# Patient Record
Sex: Male | Born: 1960
Health system: Southern US, Community
[De-identification: ages and names within clinical notes are randomized; demographics above are authoritative.]

## PROBLEM LIST (undated history)

## (undated) DIAGNOSIS — M1990 Unspecified osteoarthritis, unspecified site: Secondary | ICD-10-CM

## (undated) HISTORY — DX: Unspecified osteoarthritis, unspecified site: M19.90

---

## 1991-07-15 HISTORY — PX: CYST EXCISION: SHX5701

## 2013-10-12 ENCOUNTER — Ambulatory Visit: Payer: Commercial Managed Care - PPO

## 2013-10-20 ENCOUNTER — Ambulatory Visit (INDEPENDENT_AMBULATORY_CARE_PROVIDER_SITE_OTHER): Payer: Commercial Managed Care - PPO | Admitting: Family Medicine

## 2013-10-20 VITALS — BP 102/64 | HR 61 | Temp 97.8°F | Resp 16 | Ht 65.0 in | Wt 169.0 lb

## 2013-10-20 DIAGNOSIS — R0989 Other specified symptoms and signs involving the circulatory and respiratory systems: Secondary | ICD-10-CM

## 2013-10-20 DIAGNOSIS — R0609 Other forms of dyspnea: Secondary | ICD-10-CM

## 2013-10-20 DIAGNOSIS — R0683 Snoring: Secondary | ICD-10-CM

## 2013-10-20 NOTE — Progress Notes (Signed)
[facility] [address] [place] [address] [phone]  Date:  10/20/2013   Name:  Billy Rocha   DOB:  Mar 17, 1961   MRN:  [id number]  PCP:  No primary provider on file.    Chief Complaint: Snoring   History of Present Illness:  Billy Rocha is a 53 y.o. very pleasant male patient who presents with the following: snoring that has been present for several years.  Wife has told him that it sounds like he stops breathing at night sometimes.  Pt states that on Monday night he woke from sleep gasping for breath.  Denies any associated chest pain or diaphoresis during this incident.  Admits daytime sleepiness.  Is not falling asleep while driving or requiring daytime naps.  Also complaining of constant throat clearing with no associated dyspepsia.    He is very active and runs about 3 miles several times a week.  No CP with exercise.   There are no active problems to display for this patient.   History reviewed. No pertinent past medical history.  History reviewed. No pertinent past surgical history.  History  Substance Use Topics  . Smoking status: Never Smoker   . Smokeless tobacco: Not on file  . Alcohol Use: No    Family History  Problem Relation Age of Onset  . Cancer Mother   . Diabetes Mother   . Heart disease Mother     Not on File  Medication list has been reviewed and updated.  No current outpatient prescriptions on file prior to visit.   No current facility-administered medications on file prior to visit.    Review of Systems:  Gen: denies fevers, night sweats, wt changes Cardio- denies chest pain, palpitation, and sob Lungs- denies wheeze, cough GI- denies abdominal pain, diarrhea , constipation GU- denies painful urination, admits difficulty with maintaining erection but able to achieve Vascular- denies claudication and exercise intolerance  Physical Examination: Filed Vitals:   10/20/13 0913  BP: 102/64   Pulse: 61  Temp: 97.8 F (36.6 C)  Resp: 16   Filed Vitals:   10/20/13 0913  Height: 5\' 5"  (1.651 m)  Weight: 169 lb (76.658 kg)   Body mass index is 28.12 kg/(m^2). Ideal Body Weight:   GEN: WDWN, NAD, Non-toxic, A & O x 3, looks well, muscular build HEENT: Atraumatic, Normocephalic. Neck supple. No masses, No LAD.   He does have somewhat large tonsils and small oral cavity, prominent nasal turbinates.   Ears and Nose: No external deformity. No erythema.  Malintini 4.  CV: RRR, No M/G/R. No JVD. No thrill. No extra heart sounds. PULM: CTA B, no wheezes, crackles, rhonchi. No retractions. No resp. distress. No accessory muscle use. ABD: S, NT, ND, +BS. No rebound. No HSM.  EXTR: No c/c/e NEURO Normal gait.  PSYCH: Normally interactive. Conversant. Not depressed or anxious appearing.  Calm demeanor.     Assessment and Plan: 1. Snoring - schedule ENT visit 2. GERD - given instructions for diet control.  Given coupon for Prilosec.   Discussed in detail with pt.  He may need a sleep study to formally diagnose OSA.  However he is not overweight and does not have a large neck; suspect the issue may lie in his oral anatomy.  He would like to see ENT to find out is a tonsillectomy might be helpful for him first.    He is drinking peppermint tea before bed- stop this as it may contribute to  his GERD.   Signed Lamar Blinks, MD

## 2013-10-20 NOTE — Patient Instructions (Addendum)
You have been diagnosed with possible obstructive sleep apnea.  You have been scheduled for an appointment with an [place] and Throat doctor.  Please purchase Breath-right strips at the pharmacy to wear while you sleep.  These strips should help open your airway while you sleep.  You have also been given coupons for acid reflux. Please take 1 pill in the morning prior to eating.  Please avoid peppermint tea prior to bed time.  This should help with reflux.

## 2014-02-03 ENCOUNTER — Ambulatory Visit (INDEPENDENT_AMBULATORY_CARE_PROVIDER_SITE_OTHER): Payer: Commercial Managed Care - PPO | Admitting: Family Medicine

## 2014-02-03 VITALS — BP 106/72 | HR 56 | Temp 97.7°F | Resp 18 | Ht 64.75 in | Wt 169.6 lb

## 2014-02-03 DIAGNOSIS — R05 Cough: Secondary | ICD-10-CM

## 2014-02-03 DIAGNOSIS — J018 Other acute sinusitis: Secondary | ICD-10-CM

## 2014-02-03 DIAGNOSIS — R059 Cough, unspecified: Secondary | ICD-10-CM

## 2014-02-03 MED ORDER — BENZONATATE 100 MG PO CAPS: 100.0000 mg | ORAL_CAPSULE | Freq: Three times a day (TID) | ORAL | Status: DC | PRN

## 2014-02-03 MED ORDER — AMOXICILLIN 875 MG PO TABS: 875.0000 mg | ORAL_TABLET | Freq: Two times a day (BID) | ORAL | Status: DC

## 2014-02-03 MED ORDER — FLUTICASONE PROPIONATE 50 MCG/ACT NA SUSP: NASAL | Status: DC

## 2014-02-03 NOTE — Progress Notes (Signed)
Subjective: Last week patient has been having congestion in his head and bring a lot of purulent mucus out of his nose. He has postnasal drainage and cough. He does not smoke. He works in [employer]. He has not been feverish. Actually this been going on 9 days, and he tried to right it through but it continues to persist.  Objective: Healthy-appearing man but he has a cough and congestion. His TMs are normal. Nose congested. Throat clear. Neck supple without significant nodes. Chest clear. Heart regular without murmurs.  Assessment: Sinusitis/cough  Plan: Amoxicillin Fluticasone Tessalon He did go see an ENT doctor after his last visit. It sounds like they placed him on some medication for reflux, do not know the name. He feels like that brought on the reflux, that he was simply having throat symptoms at that time but then he started getting heartburn thereafter. He was frustrated with that. I still feel he probably might have reflux based on that history, and would suggest he see the ENT doctor back again if symptoms there continues to persist.

## 2014-02-03 NOTE — Patient Instructions (Signed)
Drink plenty of fluids to stay well hydrated  Take the antibiotic, amoxicillin, one twice daily for infection  Use the fluticasone nose spray 2 sprays each nostril twice daily for 4 days, then decrease to once daily  Take the cough pills one or 2 pills 3 times daily as needed for cough  If you continue having the reflux problems down in your chest that you were treated for at the medicine per Dr., I would recommend you return to them. It sounds like a different medication may need to be givien you.

## 2014-03-31 ENCOUNTER — Ambulatory Visit (INDEPENDENT_AMBULATORY_CARE_PROVIDER_SITE_OTHER): Payer: Commercial Managed Care - PPO | Admitting: Internal Medicine

## 2014-03-31 VITALS — BP 120/72 | HR 59 | Temp 97.9°F | Resp 18 | Ht 65.0 in | Wt 171.0 lb

## 2014-03-31 DIAGNOSIS — M542 Cervicalgia: Secondary | ICD-10-CM

## 2014-03-31 DIAGNOSIS — Z13220 Encounter for screening for lipoid disorders: Secondary | ICD-10-CM

## 2014-03-31 DIAGNOSIS — R350 Frequency of micturition: Secondary | ICD-10-CM

## 2014-03-31 DIAGNOSIS — G479 Sleep disorder, unspecified: Secondary | ICD-10-CM

## 2014-03-31 DIAGNOSIS — Z1211 Encounter for screening for malignant neoplasm of colon: Secondary | ICD-10-CM

## 2014-03-31 DIAGNOSIS — M545 Low back pain, unspecified: Secondary | ICD-10-CM

## 2014-03-31 DIAGNOSIS — R202 Paresthesia of skin: Secondary | ICD-10-CM

## 2014-03-31 DIAGNOSIS — IMO0002 Reserved for concepts with insufficient information to code with codable children: Secondary | ICD-10-CM

## 2014-03-31 DIAGNOSIS — R5383 Other fatigue: Secondary | ICD-10-CM

## 2014-03-31 DIAGNOSIS — R5381 Other malaise: Secondary | ICD-10-CM

## 2014-03-31 DIAGNOSIS — Z125 Encounter for screening for malignant neoplasm of prostate: Secondary | ICD-10-CM

## 2014-03-31 DIAGNOSIS — M1710 Unilateral primary osteoarthritis, unspecified knee: Secondary | ICD-10-CM

## 2014-03-31 DIAGNOSIS — R209 Unspecified disturbances of skin sensation: Secondary | ICD-10-CM

## 2014-03-31 DIAGNOSIS — Z0000 Encounter for general adult medical examination without abnormal findings: Secondary | ICD-10-CM

## 2014-03-31 DIAGNOSIS — Z23 Encounter for immunization: Secondary | ICD-10-CM

## 2014-03-31 DIAGNOSIS — M170 Bilateral primary osteoarthritis of knee: Secondary | ICD-10-CM | POA: Insufficient documentation

## 2014-03-31 LAB — CBC
HEMATOCRIT: 41.9 % (ref 39.0–52.0)
Hemoglobin: 14.4 g/dL (ref 13.0–17.0)
MCH: 28.6 pg (ref 26.0–34.0)
MCHC: 34.4 g/dL (ref 30.0–36.0)
MCV: 83.1 fL (ref 78.0–100.0)
PLATELETS: 184 10*3/uL (ref 150–400)
RBC: 5.04 MIL/uL (ref 4.22–5.81)
RDW: 14.2 % (ref 11.5–15.5)
WBC: 4.4 10*3/uL (ref 4.0–10.5)

## 2014-03-31 LAB — IFOBT (OCCULT BLOOD): IFOBT: NEGATIVE

## 2014-03-31 LAB — COMPREHENSIVE METABOLIC PANEL
ALBUMIN: 4.3 g/dL (ref 3.5–5.2)
ALT: 23 U/L (ref 0–53)
AST: 20 U/L (ref 0–37)
Alkaline Phosphatase: 52 U/L (ref 39–117)
BUN: 12 mg/dL (ref 6–23)
CALCIUM: 8.9 mg/dL (ref 8.4–10.5)
CHLORIDE: 104 meq/L (ref 96–112)
CO2: 26 mEq/L (ref 19–32)
Creat: 0.93 mg/dL (ref 0.50–1.35)
Glucose, Bld: 92 mg/dL (ref 70–99)
POTASSIUM: 4.5 meq/L (ref 3.5–5.3)
Sodium: 138 mEq/L (ref 135–145)
Total Bilirubin: 0.7 mg/dL (ref 0.2–1.2)
Total Protein: 7 g/dL (ref 6.0–8.3)

## 2014-03-31 LAB — LIPID PANEL
Cholesterol: 168 mg/dL (ref 0–200)
HDL: 67 mg/dL (ref >39)
LDL Cholesterol: 88 mg/dL (ref 0–99)
Total CHOL/HDL Ratio: 2.5 Ratio
Triglycerides: 67 mg/dL (ref <150)
VLDL: 13 mg/dL (ref 0–40)

## 2014-03-31 LAB — POCT URINALYSIS DIPSTICK
Bilirubin, UA: NEGATIVE
Blood, UA: NEGATIVE
GLUCOSE UA: NEGATIVE
KETONES UA: NEGATIVE
LEUKOCYTES UA: NEGATIVE
Nitrite, UA: NEGATIVE
Protein, UA: NEGATIVE
Spec Grav, UA: 1.015
UROBILINOGEN UA: 0.2
pH, UA: 8.5

## 2014-03-31 NOTE — Progress Notes (Signed)
Subjective:    Patient ID: Billy Rocha, male    DOB: 1961/02/02, 53 y.o.   MRN: [id number] This chart was scribed for [place]. Laney Pastor, MD by Steva Colder, ED Scribe. The patient was seen in room 4 at 9:32 AM.   Chief Complaint  Patient presents with  . Annual Exam    HPI Billy Rocha is a 53 y.o. male who presents today complaining of an annual exam. He states that he he came and visited Dr. Edilia Bo for his sleeping issues and she sent him to a ENT specialist who states that he was dx with heartburn. He states that the issues have gotten worse. He states that he will not go back to the specialist. He states that he was 53 years old when it first happened. He states that the issues are intermittent with the last one being 1 year ago. He states that he has to clear his throat every night before he goes to bed. He states that his wife states that he will stop snoring and breathing at night time. He states that he has not had a sleep study conducted before. He states that he is having associated symptoms of neck pain, back pain, and intermittment numbness in both legs after sitting for awhile. He denies tingling, and any other associated symptoms. He states that he does not like to go to doctors because they don't always help. He states that he runs 3 days a week for his exercise, and he does not stretch as he should. He denies having a colonoscopy. He states that no one in his family has had colon CA or prostate CA. He states that he does not remember when his last tetanus shot was. He states that he had his cholesterol checked 1 year ago.    There are no active problems to display for this patient.  History reviewed. No pertinent past medical history. History reviewed. No pertinent past surgical history. No Known Allergies Prior to Admission medications   Medication Sig Start Date End Date Taking? Authorizing Provider  amoxicillin (AMOXIL) 875 MG tablet Take 1 tablet  (875 mg total) by mouth 2 (two) times daily. 02/03/14   Posey Boyer, MD  benzonatate (TESSALON) 100 MG capsule Take 1-2 capsules (100-200 mg total) by mouth 3 (three) times daily as needed. 02/03/14   Posey Boyer, MD  fluticasone Asencion Islam) 50 MCG/ACT nasal spray Use 2 sprays each nostril twice daily for 4 days, then decrease to once daily 02/03/14   Posey Boyer, MD      Review of Systems  14 points reviewed by intake form Positives covered in present illness Remainder systems negative  Objective:  Physical Exam  Constitutional: He is oriented to person, place, and time. He appears well-developed and well-nourished.  HENT:  Head: Normocephalic.  Right Ear: External ear normal.  Left Ear: External ear normal.  Nose: Nose normal.  Mouth/Throat: Oropharynx is clear and moist.  Tms and canals clear Shallow posterior pharynx  Eyes: Conjunctivae and EOM are normal. Pupils are equal, round, and reactive to light.  Neck: Normal range of motion. Neck supple. No thyromegaly present.  Cardiovascular: Normal rate, regular rhythm, normal heart sounds and intact distal pulses.   No murmur heard. Pulmonary/Chest: Effort normal and breath sounds normal. No respiratory distress. He has no wheezes. He has no rales.  Abdominal: Soft. Bowel sounds are normal. He exhibits no distension and no mass. There is no tenderness. There is no rebound and no  guarding.  No hepatosplenomegaly  Genitourinary: Guaiac negative stool.  Prostate is symmetrical and enlarged but soft nontender without nodules  Musculoskeletal: Normal range of motion. He exhibits no edema and no tenderness.  He has very little leg extension due to muscle tightness about both hips and both posterior thighs Straight leg raise does not create radicular symptoms from the lumbar area He has no peripheral motor losses Deep tendon reflexes are muted but symmetrical  there is some tenderness with extension of the knees but no effusion or  ligamentous laxity There is some tenderness in the patellar tendon mechanism bilaterally   Lymphadenopathy:    He has no cervical adenopathy.  Neurological: He is alert and oriented to person, place, and time. He has normal reflexes. No cranial nerve deficit. He exhibits normal muscle tone. Coordination normal.  Skin: Skin is warm and dry. No rash noted.  Psychiatric: He has a normal mood and affect. His behavior is normal. Judgment and thought content normal.    BP 120/72  Pulse 59  Temp(Src) 97.9 F (36.6 C) (Oral)  Resp 18  Ht 5\' 5"  (1.651 m)  Wt 171 lb (77.565 kg)  BMI 28.46 kg/m2  SpO2 97%  Assessment & Plan:  I personally performed the services described in this documentation, which was scribed in my presence. The recorded information has been reviewed and is accurate.   Routine general medical examination at a health care facility - Plan: POCT urinalysis dipstick, CBC, Comprehensive metabolic panel, Lipid panel, PSA, IFOBT POC (occult bld, rslt in office)  Screening cholesterol level - Plan: Lipid panel  Screening for prostate cancer - Plan: PSA  Colon cancer screening - Plan: Comprehensive metabolic panel  Other fatigue - with observed snoring and  Apnea, and daytime hypersomnolence  Paresthesia--in both both legs exacerbated by sitting position  Neck pain  Low back pain without sciatica, unspecified back pain laterality  Frequent urination  Need for prophylactic vaccination with combined diphtheria-tetanus-pertussis (DTP) vaccine - Plan: Tdap vaccine greater than or equal to 7yo IM  Arthritis of both knees  He needs significant work on flexibility about all his joints with an intention to repair his chronic neck and low back pain and symptoms about his knees it may be secondary to running without an adequate leg lift due to hip tightness and quads tightness--- referred to sports medicine clinic and to [facility]  He needs a sleep study as he has  hypersomnolence with observed apnea and significant snoring  He is referred for colonoscopy-retaining  He may be a candidate for Flomax to prevent nocturia if this becomes a problem again  Patient Instructions  Physical therapy with Barbaraann Barthel at downtown [facility] Screening colonoscopy needed to check for colon cancer Overnight sleep assessment needed to be sure there is no sleep apnea Labs will be mailed to you

## 2014-03-31 NOTE — Patient Instructions (Addendum)
Physical therapy with Barbaraann Barthel at downtown [facility] Screening colonoscopy needed to check for colon cancer Overnight sleep assessment needed to be sure there is no sleep apnea Labs will be mailed to you  Tdap Vaccine (Tetanus, Diphtheria, Pertussis): What You Need to Know 1. Why get vaccinated? Tetanus, diphtheria and pertussis can be very serious diseases, even for adolescents and adults. Tdap vaccine can protect [place] from these diseases. TETANUS (Lockjaw) causes painful muscle tightening and stiffness, usually all over the body.  It can lead to tightening of muscles in the head and neck so you can't open your mouth, swallow, or sometimes even breathe. Tetanus kills about 1 out of 5 people who are infected. DIPHTHERIA can cause a thick coating to form in the back of the throat.  It can lead to breathing problems, paralysis, heart failure, and death. PERTUSSIS (Whooping Cough) causes severe coughing spells, which can cause difficulty breathing, vomiting and disturbed sleep.  It can also lead to weight loss, incontinence, and rib fractures. Up to 2 in 100 adolescents and 5 in 100 adults with pertussis are hospitalized or have complications, which could include pneumonia or death. These diseases are caused by bacteria. Diphtheria and pertussis are spread from person to person through coughing or sneezing. Tetanus enters the body through cuts, scratches, or wounds. Before vaccines, the [place] saw as many as 200,000 cases a year of diphtheria and pertussis, and hundreds of cases of tetanus. Since vaccination began, tetanus and diphtheria have dropped by about 99% and pertussis by about 80%. 2. Tdap vaccine Tdap vaccine can protect adolescents and adults from tetanus, diphtheria, and pertussis. One dose of Tdap is routinely given at age 15 or 81. People who did not get Tdap at that age should get it as soon as possible. Tdap is especially important for health care professionals and anyone  having close contact with a baby younger than 12 months. Pregnant women should get a dose of Tdap during every pregnancy, to protect the newborn from pertussis. Infants are most at risk for severe, life-threatening complications from pertussis. A similar vaccine, called Td, protects from tetanus and diphtheria, but not pertussis. A Td booster should be given every 10 years. Tdap may be given as one of these boosters if you have not already gotten a dose. Tdap may also be given after a severe cut or burn to prevent tetanus infection. Your doctor can give you more information. Tdap may safely be given at the same time as other vaccines. 3. Some people should not get this vaccine  If you ever had a life-threatening allergic reaction after a dose of any tetanus, diphtheria, or pertussis containing vaccine, OR if you have a severe allergy to any part of this vaccine, you should not get Tdap. Tell your doctor if you have any severe allergies.  If you had a coma, or long or multiple seizures within 7 days after a childhood dose of DTP or DTaP, you should not get Tdap, unless a cause other than the vaccine was found. You can still get Td.  Talk to your doctor if you:  have epilepsy or another nervous system problem,  had severe pain or swelling after any vaccine containing diphtheria, tetanus or pertussis,  ever had Guillain-Barr Syndrome (GBS),  aren't feeling well on the day the shot is scheduled. 4. Risks of a vaccine reaction With any medicine, including vaccines, there is a chance of side effects. These are usually mild and go away on their own, but  serious reactions are also possible. Brief fainting spells can follow a vaccination, leading to injuries from falling. Sitting or lying down for about 15 minutes can help prevent these. Tell your doctor if you feel dizzy or light-headed, or have vision changes or ringing in the ears. Mild problems following Tdap (Did not interfere with  activities)  Pain where the shot was given (about 3 in 4 adolescents or 2 in 3 adults)  Redness or swelling where the shot was given (about 1 person in 5)  Mild fever of at least 100.27F (up to about 1 in 25 adolescents or 1 in 100 adults)  Headache (about 3 or 4 people in 10)  Tiredness (about 1 person in 3 or 4)  Nausea, vomiting, diarrhea, stomach ache (up to 1 in 4 adolescents or 1 in 10 adults)  Chills, body aches, sore joints, rash, swollen glands (uncommon) Moderate problems following Tdap (Interfered with activities, but did not require medical attention)  Pain where the shot was given (about 1 in 5 adolescents or 1 in 100 adults)  Redness or swelling where the shot was given (up to about 1 in 16 adolescents or 1 in 25 adults)  Fever over 102F (about 1 in 100 adolescents or 1 in 250 adults)  Headache (about 3 in 20 adolescents or 1 in 10 adults)  Nausea, vomiting, diarrhea, stomach ache (up to 1 or 3 people in 100)  Swelling of the entire arm where the shot was given (up to about 3 in 100). Severe problems following Tdap (Unable to perform usual activities; required medical attention)  Swelling, severe pain, bleeding and redness in the arm where the shot was given (rare). A severe allergic reaction could occur after any vaccine (estimated less than 1 in a million doses). 5. What if there is a serious reaction? What should I look for?  Look for anything that concerns you, such as signs of a severe allergic reaction, very high fever, or behavior changes. Signs of a severe allergic reaction can include hives, swelling of the face and throat, difficulty breathing, a fast heartbeat, dizziness, and weakness. These would start a few minutes to a few hours after the vaccination. What should I do?  If you think it is a severe allergic reaction or other emergency that can't wait, call 9-1-1 or get the person to the nearest hospital. Otherwise, call your doctor.  Afterward,  the reaction should be reported to the "Vaccine Adverse Event Reporting System" (VAERS). Your doctor might file this report, or you can do it yourself through the VAERS web site at www.vaers.SamedayNews.es, or by calling (814)318-8945. VAERS is only for reporting reactions. They do not give medical advice.  6. The National Vaccine Injury Compensation Program The [employer] Vaccine Injury Compensation Program (VICP) is a federal program that was created to compensate people who may have been injured by certain vaccines. Persons who believe they may have been injured by a vaccine can learn about the program and about filing a claim by calling 819-013-8473 or visiting the [place] website at GoldCloset.com.ee. 7. How can I learn more?  Ask your doctor.  Call your local or state health department.  Contact the Centers for Disease Control and Prevention (CDC):  Call 986-063-7348 or visit CDC's website at http://hunter.com/. CDC Tdap Vaccine VIS (11/20/11) Document Released: 12/30/2011 Document Revised: 11/14/2013 Document Reviewed: 10/12/2013 ExitCare Patient Information 2015 [place], [place]. This information is not intended to replace advice given to you by your health care provider. Make sure you  discuss any questions you have with your health care provider.  

## 2014-04-01 LAB — PSA: PSA: 0.81 ng/mL (ref <=4.00)

## 2014-04-03 ENCOUNTER — Encounter: Payer: Self-pay | Admitting: Internal Medicine

## 2014-04-10 ENCOUNTER — Institutional Professional Consult (permissible substitution): Payer: Commercial Managed Care - PPO | Admitting: Neurology

## 2014-09-14 ENCOUNTER — Ambulatory Visit (INDEPENDENT_AMBULATORY_CARE_PROVIDER_SITE_OTHER): Payer: Commercial Managed Care - PPO | Admitting: Family Medicine

## 2014-09-14 VITALS — BP 108/64 | HR 51 | Temp 97.8°F | Resp 16 | Ht 65.5 in | Wt 175.8 lb

## 2014-09-14 DIAGNOSIS — M545 Low back pain, unspecified: Secondary | ICD-10-CM

## 2014-09-14 LAB — POCT UA - MICROSCOPIC ONLY
Bacteria, U Microscopic: NEGATIVE
Casts, Ur, LPF, POC: NEGATIVE
Crystals, Ur, HPF, POC: NEGATIVE
Epithelial cells, urine per micros: NEGATIVE
MUCUS UA: NEGATIVE
RBC, urine, microscopic: NEGATIVE
WBC, UR, HPF, POC: NEGATIVE
Yeast, UA: NEGATIVE

## 2014-09-14 LAB — POCT URINALYSIS DIPSTICK
Bilirubin, UA: NEGATIVE
Glucose, UA: NEGATIVE
Ketones, UA: NEGATIVE
Leukocytes, UA: NEGATIVE
Nitrite, UA: NEGATIVE
PROTEIN UA: NEGATIVE
RBC UA: NEGATIVE
UROBILINOGEN UA: 0.2
pH, UA: 6

## 2014-09-14 MED ORDER — NAPROXEN 500 MG PO TABS: 500.0000 mg | ORAL_TABLET | Freq: Two times a day (BID) | ORAL | Status: DC

## 2014-09-14 MED ORDER — TRAMADOL HCL 50 MG PO TABS: 50.0000 mg | ORAL_TABLET | Freq: Three times a day (TID) | ORAL | Status: DC | PRN

## 2014-09-14 MED ORDER — METHOCARBAMOL 500 MG PO TABS: ORAL_TABLET | ORAL | Status: DC

## 2014-09-14 NOTE — Patient Instructions (Addendum)
Study the back owners manual  Take the Robaxin (methocarbamol) one in the morning, 1 in the afternoon, and 2 at bedtime for muscle relaxant  Take the naproxen one twice daily after breakfast and after supper for pain and inflammation  Alternate using heat and ice on the back, or just heat alone, 2 or 3 times a day should help give some relief  Avoid heavy lifting and straining  Take the tramadol only if needed for severe pain, especially at nighttime if needed for rest  Return at any time if worse, or in a couple of weeks if not improving

## 2014-09-14 NOTE — Progress Notes (Signed)
Subjective: 54 year old man previously known to me who came in with a history of having pain bilaterally in the mid low back for the past for 5 days. He knows of no specific injury. 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 He does do some exercise with running. He has not had major back problems in the past except for some muscle problems occasionally. He has been concerned whether he could have something going on with his kidneys or lungs. He does not smoke. He points to the back paraspinous muscle area a couple of inches out from the spine on both sides midway between the ribs and the pelvis. No bowel or bladder problems.  Objective: No major distress. Alert and oriented. Apparently cannot stay lying down more than about 6 hours without having a lot of pain. It does disrupt his sleep. His flexion is fair though it causes some pain extension causes pain side to side tilt does not seem to cause a lot of pain. Straight leg raise test is negative bilaterally. Deep tendon reflexes 2+ symmetrical in the ankles and knees. Lungs are clear to auscultation. Abdomen soft without mass or tenderness.  Assessment: Mid to low back pain  Plan: Urinalysis Results for orders placed or performed in visit on 09/14/14  POCT urinalysis dipstick  Result Value Ref Range   Color, UA yellow    Clarity, UA clear    Glucose, UA neg    Bilirubin, UA neg    Ketones, UA neg    Spec Grav, UA <=1.005    Blood, UA neg    pH, UA 6.0    Protein, UA neg    Urobilinogen, UA 0.2    Nitrite, UA neg    Leukocytes, UA Negative   POCT UA - Microscopic Only  Result Value Ref Range   WBC, Ur, HPF, POC neg    RBC, urine, microscopic neg    Bacteria, U Microscopic neg    Mucus, UA neg    Epithelial cells, urine per micros neg    Crystals, Ur, HPF, POC neg    Casts, Ur, LPF, POC neg    Yeast, UA neg    Muscular mid and low back pain without sciatica.   Will treat  with muscle relaxant and anti-inflammatory and pain medication.

## 2015-03-26 ENCOUNTER — Ambulatory Visit (INDEPENDENT_AMBULATORY_CARE_PROVIDER_SITE_OTHER): Payer: Commercial Managed Care - PPO | Admitting: Internal Medicine

## 2015-03-26 ENCOUNTER — Ambulatory Visit (INDEPENDENT_AMBULATORY_CARE_PROVIDER_SITE_OTHER): Payer: Commercial Managed Care - PPO

## 2015-03-26 VITALS — BP 100/58 | HR 56 | Temp 98.2°F | Resp 18 | Ht 64.5 in | Wt 173.0 lb

## 2015-03-26 DIAGNOSIS — M5441 Lumbago with sciatica, right side: Secondary | ICD-10-CM

## 2015-03-26 DIAGNOSIS — G8929 Other chronic pain: Secondary | ICD-10-CM

## 2015-03-26 DIAGNOSIS — M545 Low back pain: Secondary | ICD-10-CM | POA: Diagnosis not present

## 2015-03-26 MED ORDER — NAPROXEN SODIUM 550 MG PO TABS: 550.0000 mg | ORAL_TABLET | Freq: Two times a day (BID) | ORAL | Status: DC

## 2015-03-26 MED ORDER — CYCLOBENZAPRINE HCL 10 MG PO TABS: 10.0000 mg | ORAL_TABLET | Freq: Three times a day (TID) | ORAL | Status: DC | PRN

## 2015-03-26 NOTE — Patient Instructions (Signed)
Use anaprox twice daily. Do not use any other products with this containing ibuprofen, naprosyn or aspirin. You MAY use tylenol with this. Heat, gentle massage and gentle stretching can help. Remain active, as inactivity can cause more pain. Don't do anything too strenuous though. If you develop new numbness, weakness or incontinence of urine or bowel, return to clinic or go to the emergency room ASAP. You will get a phone call to make appt with PT -- check to see how your insurance covers this. If anything, go for a couple sessions to get exercises to do at home.

## 2015-03-26 NOTE — Progress Notes (Signed)
[facility] [address], [place] [address] [phone]  Date:  03/26/2015   Name:  Billy Rocha   DOB:  29-Apr-1961   MRN:  [id number]  PCP:  No PCP Per Patient    Chief Complaint: Back Pain   History of Present Illness:  This is a 54 y.o. male with PMH knee OA who is presenting with back pain x 5 days. States he had a sneezing fit 5 days ago and that's when the pain started. Pain was bilateral but now more on the right side. Today started radiating into right buttock. Pain described as soreness. Gets more intense pain with lumbar flexion. Sitting down bothers him as well. Laying down makes better. Denies paresthesias, weakness or problems with bowel/bladder. Taking tylenol and advil and no help. First had problems with a low back muscular injury 6 years ago. For the past 2-3 years he has daily low back pain that is worse with remaining in prolonged positions. He states after 2 hours of sitting or laying he has to get up and move. Walking will make his daily pain better. He is a runner and runs 3 days a week. He has never had xrays of his lumbar spine.  Review of Systems:  Review of Systems See HPI  Patient Active Problem List   Diagnosis Date Noted  . Arthritis of both knees 03/31/2014    Prior to Admission medications   Not on File    No Known Allergies  History reviewed. No pertinent past surgical history.  Social History  Substance Use Topics  . Smoking status: Never Smoker   . Smokeless tobacco: None  . Alcohol Use: No    Family History  Problem Relation Age of Onset  . Cancer Mother   . Diabetes Mother   . Heart disease Mother   . Diabetes Sister   . Diabetes Brother     Medication list has been reviewed and updated.  Physical Examination:  Physical Exam  Constitutional: He is oriented to person, place, and time. He appears well-developed and well-nourished. No distress.  HENT:  Head: Normocephalic and atraumatic.   Right Ear: Hearing normal.  Left Ear: Hearing normal.  Nose: Nose normal.  Eyes: Conjunctivae and lids are normal. Right eye exhibits no discharge. Left eye exhibits no discharge. No scleral icterus.  Cardiovascular: Normal rate, regular rhythm, normal heart sounds and normal pulses.   No murmur heard. Pulmonary/Chest: Effort normal and breath sounds normal. No respiratory distress. He has no wheezes. He has no rhonchi. He has no rales.  Abdominal: Soft. Normal appearance. There is no tenderness.  Musculoskeletal:       Lumbar back: He exhibits decreased range of motion (decreased flexion d/t pain) and tenderness (right paraspinal). He exhibits no bony tenderness.  Straight leg raise negative but did produce pain in right low back  Neurological: He is alert and oriented to person, place, and time. He has normal strength and normal reflexes. No sensory deficit. Gait normal.  Skin: Skin is warm, dry and intact. No lesion and no rash noted.  Psychiatric: He has a normal mood and affect. His speech is normal and behavior is normal. Thought content normal.   BP 100/58 mmHg  Pulse 56  Temp(Src) 98.2 F (36.8 C) (Oral)  Resp 18  Ht 5' 4.5" (1.638 m)  Wt 173 lb (78.472 kg)  BMI 29.25 kg/m2  SpO2 99%  [facility] reading (PRIMARY) by  Dr. Laney Pastor: negative  Assessment and Plan:  1. Right-sided low back pain with right-sided sciatica 2. Chronic low back pain Xray negative. Anaprox and flexeril for more acute pain. Gave pamphlet on back exercises to do. We discussed PT and he was interested in going to at least a few sessions to learn exercises to do at home. Discussed return precautions. - DG Lumbar Spine Complete; Future - naproxen sodium (ANAPROX) 550 MG tablet; Take 1 tablet (550 mg total) by mouth 2 (two) times daily with a meal.  Dispense: 30 tablet; Refill: 0 - cyclobenzaprine (FLEXERIL) 10 MG tablet; Take 1 tablet (10 mg total) by mouth 3 (three) times daily as needed for muscle spasms.   Dispense: 30 tablet; Refill: 0 - Ambulatory referral to Physical Therapy   Benjaman Pott. Drenda Freeze, MHS [facility]  03/27/2015 I have participated in the care of this patient with the Advanced Practice Provider and agree with Diagnosis and Plan as documented. Robert P. Laney Pastor, M.D.

## 2015-05-20 ENCOUNTER — Emergency Department (HOSPITAL_COMMUNITY)
Admission: EM | Admit: 2015-05-20 | Discharge: 2015-05-20 | Disposition: A | Discharge status: Home / Self Care | Payer: Worker's Compensation | Attending: Emergency Medicine | Admitting: Emergency Medicine

## 2015-05-20 ENCOUNTER — Encounter (HOSPITAL_COMMUNITY): Payer: Self-pay | Admitting: Emergency Medicine

## 2015-05-20 DIAGNOSIS — Z791 Long term (current) use of non-steroidal anti-inflammatories (NSAID): Secondary | ICD-10-CM | POA: Insufficient documentation

## 2015-05-20 DIAGNOSIS — Y9389 Activity, other specified: Secondary | ICD-10-CM | POA: Diagnosis not present

## 2015-05-20 DIAGNOSIS — W2209XA Striking against other stationary object, initial encounter: Secondary | ICD-10-CM | POA: Diagnosis not present

## 2015-05-20 DIAGNOSIS — Y9289 Other specified places as the place of occurrence of the external cause: Secondary | ICD-10-CM | POA: Insufficient documentation

## 2015-05-20 DIAGNOSIS — Y990 Civilian activity done for income or pay: Secondary | ICD-10-CM | POA: Insufficient documentation

## 2015-05-20 DIAGNOSIS — S0990XA Unspecified injury of head, initial encounter: Secondary | ICD-10-CM | POA: Diagnosis not present
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
## 2015-05-20 NOTE — ED Notes (Signed)
Declined W/C at D/C and was escorted to lobby by RN. 
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
## 2015-05-20 NOTE — Discharge Instructions (Signed)
Head Injury, Adult °You have a head injury. Headaches and throwing up (vomiting) are common after a head injury. It should be easy to wake up from sleeping. Sometimes you must stay in the hospital. Most problems happen within the first 24 hours. Side effects may occur up to 7-10 days after the injury.  °WHAT ARE THE TYPES OF HEAD INJURIES? °Head injuries can be as minor as a bump. Some head injuries can be more severe. More severe head injuries include: °· A jarring injury to the brain (concussion). °· A bruise of the brain (contusion). This mean there is bleeding in the brain that can cause swelling. °· A cracked skull (skull fracture). °· Bleeding in the brain that collects, clots, and forms a bump (hematoma). °WHEN SHOULD I GET HELP RIGHT AWAY?  °· You are confused or sleepy. °· You cannot be woken up. °· You feel sick to your stomach (nauseous) or keep throwing up (vomiting). °· Your dizziness or unsteadiness is getting worse. °· You have very bad, lasting headaches that are not helped by medicine. Take medicines only as told by your doctor. °· You cannot use your arms or legs like normal. °· You cannot walk. °· You notice changes in the black spots in the center of the colored part of your eye (pupil). °· You have clear or bloody fluid coming from your nose or ears. °· You have trouble seeing. °During the next 24 hours after the injury, you must stay with someone who can watch you. This person should get help right away (call 911 in the [place] if you start to shake and are not able to control it (have seizures), you pass out, or you are unable to wake up. °HOW CAN I PREVENT A HEAD INJURY IN THE FUTURE? °· Wear seat belts. °· Wear a helmet while bike riding and playing sports like football. °· Stay away from dangerous activities around the house. °WHEN CAN I RETURN TO NORMAL ACTIVITIES AND ATHLETICS? °See your doctor before doing these activities. You should not do normal activities or play contact sports until 1  week after the following symptoms have stopped: °· Headache that does not go away. °· Dizziness. °· Poor attention. °· Confusion. °· Memory problems. °· Sickness to your stomach or throwing up. °· Tiredness. °· Fussiness. °· Bothered by bright lights or loud noises. °· Anxiousness or depression. °· Restless sleep. °MAKE SURE YOU:  °· Understand these instructions. °· Will watch your condition. °· Will get help right away if you are not doing well or get worse. °  °This information is not intended to replace advice given to you by your health care provider. Make sure you discuss any questions you have with your health care provider. °  °Document Released: 06/12/2008 Document Revised: 07/21/2014 Document Reviewed: 03/07/2013 °Elsevier Interactive Patient Education ©2016 Elsevier Inc. ° °

## 2015-05-21 ENCOUNTER — Ambulatory Visit (INDEPENDENT_AMBULATORY_CARE_PROVIDER_SITE_OTHER): Payer: Commercial Managed Care - PPO | Admitting: Internal Medicine

## 2015-05-21 VITALS — BP 116/68 | HR 56 | Temp 98.5°F | Resp 16 | Ht 65.5 in | Wt 176.0 lb

## 2015-05-21 DIAGNOSIS — M5442 Lumbago with sciatica, left side: Secondary | ICD-10-CM | POA: Diagnosis not present

## 2015-05-21 MED ORDER — METHOCARBAMOL 750 MG PO TABS: 750.0000 mg | ORAL_TABLET | Freq: Four times a day (QID) | ORAL | Status: DC

## 2015-05-21 NOTE — Patient Instructions (Signed)
Ciática  °(Sciatica) ° La ciática es el dolor, debilidad, entumecimiento u hormigueo a lo largo del nervio ciático. El nervio comienza en la zona inferior de la espalda y desciende por la parte posterior de cada pierna. El nervio controla los músculos de la parte inferior de la pierna y de la zona posterior de la rodilla, y transmite la sensibilidad a la parte posterior del muslo, la pierna y la planta del pie. La ciática es un síntoma de otras afecciones médicas. Por ejemplo, un daño a los nervios o algunas enfermedades como un disco herniado o un espolón óseo en la columna vertebral, podrían dañarle o presionar en el nervio ciático. Esto causa dolor, debilidad y otras sensaciones normalmente asociadas con la ciática. Generalmente la ciática afecta sólo un lado del cuerpo. °CAUSAS  °· Disco herniado o desplazado. °· Enfermedad degenerativa del disco. °· Un síndrome doloroso que compromete un músculo angosto de los glúteos (síndrome piriforme). °· Lesión o fractura pélvica. °· Embarazo. °· Tumor (casos raros). °SÍNTOMAS  °Los síntomas pueden variar de leves a muy graves. Por lo general, los síntomas descienden desde la zona lumbar a las nalgas y la parte posterior de la pierna. Ellos son:  °· Hormigueo leve o dolor sordo en la parte inferior de la espalda, la pierna o la cadera. °· Adormecimiento en la parte posterior de la pantorrilla o la planta del pie. °· Sensación de quemazón en la zona lumbar, la pierna o la cadera. °· Dolor agudo en la zona inferior de la espalda, la pierna o la cadera. °· Debilidad en las piernas. °· Dolor de espalda intenso que inhibe los movimientos. °Los síntomas pueden empeorar al toser, estornudar, reír o estar sentado o parado durante mucho tiempo. Además, el sobrepeso puede empeorar los síntomas.  °DIAGNÓSTICO  °Su médico le hará un examen físico para buscar los síntomas comunes de la ciática. Le pedirá que haga algunos movimientos o actividades que activarían el dolor del nervio  ciático. Para encontrar las causas de la ciática podrá indicarle otros estudios. Estos pueden ser:  °· Análisis de sangre. °· Radiografías. °· Pruebas de diagnóstico por imágenes, como resonancia magnética o tomografía computada. °TRATAMIENTO  °El tratamiento se dirige a las causas de la ciática. A veces, el tratamiento no es necesario, y el dolor y el malestar desaparecen por sí mismos. Si necesita tratamiento, su médico puede sugerir:  °· Medicamentos de venta libre para aliviar el dolor. °· Medicamentos recetados, como antiinflamatorios, relajantes musculares o narcóticos. °· Aplicación de calor o hielo en la zona del dolor. °· Inyecciones de corticoides para disminuir el dolor, la irritación y la inflamación alrededor del nervio. °· Reducción de la actividad en los períodos de dolor. °· Ejercicios y estiramiento del abdomen para fortalecer y mejorar la flexibilidad de la columna vertebral. Su médico puede sugerirle perder peso si el peso extra empeora el dolor de espalda. °· Fisioterapia. °· La cirugía para eliminar lo que presiona o pincha el nervio, como un espolón óseo o parte de una hernia de disco. °INSTRUCCIONES PARA EL CUIDADO EN EL HOGAR  °· Sólo tome medicamentos de venta libre o recetados para calmar el dolor o el malestar, según las indicaciones de su médico. °· Aplique hielo sobre el área dolorida durante 20 minutos 3-4 veces por día durante los primeras 48-72 horas. Luego intente aplicar calor de la misma manera. °· Haga ejercicios, elongue o realice sus actividades habituales, si no le causan más dolor. °· Cumpla con todas las sesiones de fisioterapia, según le   indique su médico. °· Cumpla con todas las visitas de control, según le indique su médico. °· No use tacones altos o zapatos que no tengan buen apoyo. °· Verifique que el colchón no sea muy blando. Un colchón firme aliviará el dolor y las molestias. °SOLICITE ATENCIÓN MÉDICA DE INMEDIATO SI:  °· Pierde el control de la vejiga o del intestino  (incontinencia). °· Aumenta la debilidad en la zona inferior de la espalda, la pelvis, las nalgas o las piernas. °· Siente irritación o inflamación en la espalda. °· Tiene sensación de ardor al orinar. °· El dolor empeora cuando se acuesta o lo despierta por la noche. °· El dolor es peor del que experimentó en el pasado. °· Dura más de 4 semanas. °· Pierde peso sin motivo de manera súbita. °ASEGÚRESE DE QUE:  °· Comprende estas instrucciones. °· Controlará su enfermedad. °· Solicitará ayuda de inmediato si no mejora o si empeora. °  °Esta información no tiene como fin reemplazar el consejo del médico. Asegúrese de hacerle al médico cualquier pregunta que tenga. °  °Document Released: 06/30/2005 Document Revised: 03/21/2015 °Elsevier Interactive Patient Education ©2016 Elsevier Inc. ° °

## 2015-05-21 NOTE — Progress Notes (Signed)
Patient ID: Billy Rocha, male   DOB: 11-Sep-1960, 54 y.o.   MRN: [id number]   05/21/2015 at 10:13 AM  Billy Rocha / DOB: 05-25-1961 / MRN: [id number]  Problem list reviewed and updated by me where necessary.   SUBJECTIVE  Billy Rocha is a 54 y.o. well appearing male presenting for the chief complaint of left low back pain with left leg radiculopathy. Has an abnormal gait. Also has knee arthritis.Marland Kitchen   He has mild degenerative changes and DDD L3-L5 on 03/2015 xrays.   He  has a past medical history of Arthritis.    Medications reviewed and updated by myself where necessary, and exist elsewhere in the encounter.   Billy Rocha has No Known Allergies. He  reports that he has never smoked. He does not have any smokeless tobacco history on file. He reports that he does not drink alcohol or use illicit drugs. He  has no sexual activity history on file. The patient  has no past surgical history on file.  His family history includes Cancer in his mother; Diabetes in his brother, mother, and sister; Heart disease in his mother.  Review of Systems  Constitutional: Negative for fever.  Respiratory: Negative for shortness of breath.   Cardiovascular: Negative for chest pain.  Gastrointestinal: Negative for nausea.  Skin: Negative for rash.  Neurological: Negative for dizziness and headaches.    OBJECTIVE  His  height is 5' 5.5" (1.664 m) and weight is 176 lb (79.833 kg). His oral temperature is 98.5 F (36.9 C). His blood pressure is 116/68 and his pulse is 56. His respiration is 16 and oxygen saturation is 99%.  The patient's body mass index is 28.83 kg/(m^2).  Physical Exam  Constitutional: He is oriented to person, place, and time. He appears well-developed and well-nourished. No distress.  HENT:  Head: Normocephalic.  Nose: Nose normal.  Eyes: Conjunctivae and EOM are normal.  Respiratory: Effort normal.  Musculoskeletal:       Lumbar back: He  exhibits decreased range of motion, tenderness, pain and spasm. He exhibits no bony tenderness, no swelling, no edema, no deformity and normal pulse.  Neurological: He is alert and oriented to person, place, and time. He has normal strength and normal reflexes. He displays no atrophy and no tremor. No cranial nerve deficit or sensory deficit. He exhibits normal muscle tone. He displays a negative Romberg sign. Gait abnormal. Coordination normal.  Straight leg negative, no weakness numbness, or incontinence.  Psychiatric: He has a normal mood and affect.    No results found for this or any previous visit (from the past 24 hour(s)).  ASSESSMENT & PLAN  Billy Rocha was seen today for back pain.  Diagnoses and all orders for this visit:  Left-sided low back pain with left-sided sciatica -     methocarbamol (ROBAXIN-750) 750 MG tablet; Take 1 tablet (750 mg total) by mouth 4 (four) times daily. -     Ambulatory referral to Physical Therapy   Back exercises demonstrated

## 2015-10-18 IMAGING — CR DG LUMBAR SPINE COMPLETE 4+V
5 series · 5 of 5 positions shown · non-contrast
Comparison: None.

CLINICAL DATA: Back pain of unknown origin.

EXAM:
LUMBAR SPINE - COMPLETE 4+ VIEW

[AP (1 of 2)]
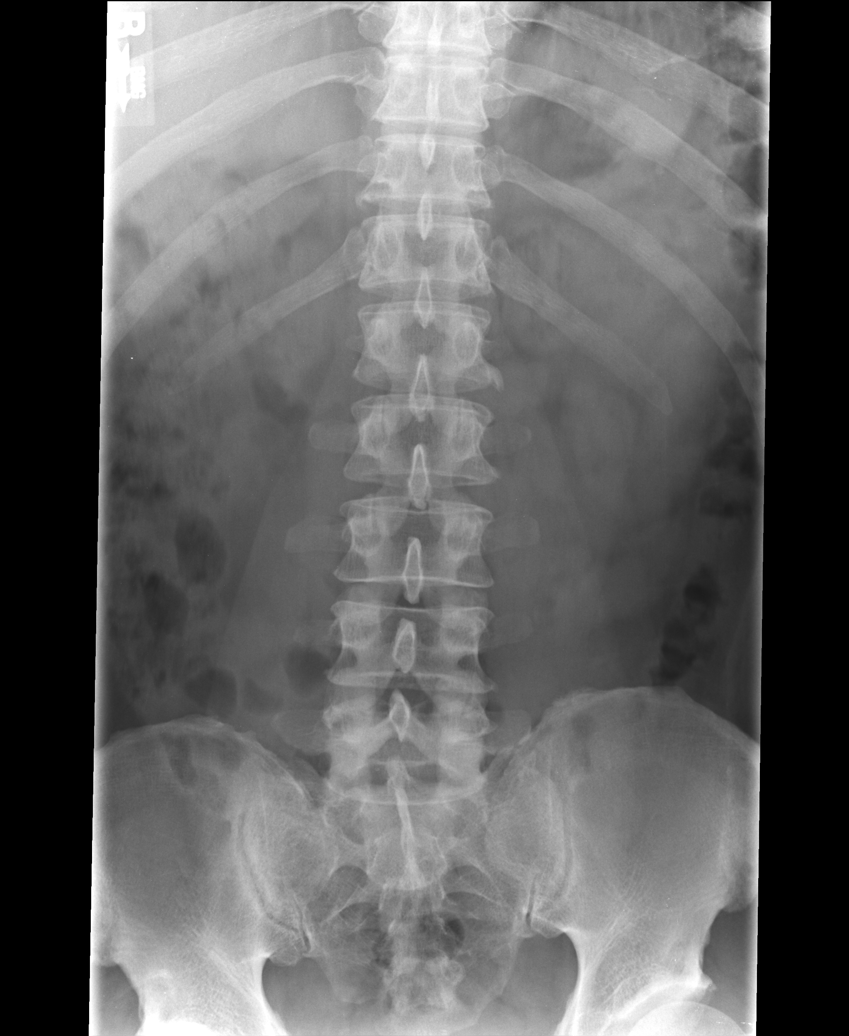

[AP (2 of 2)]
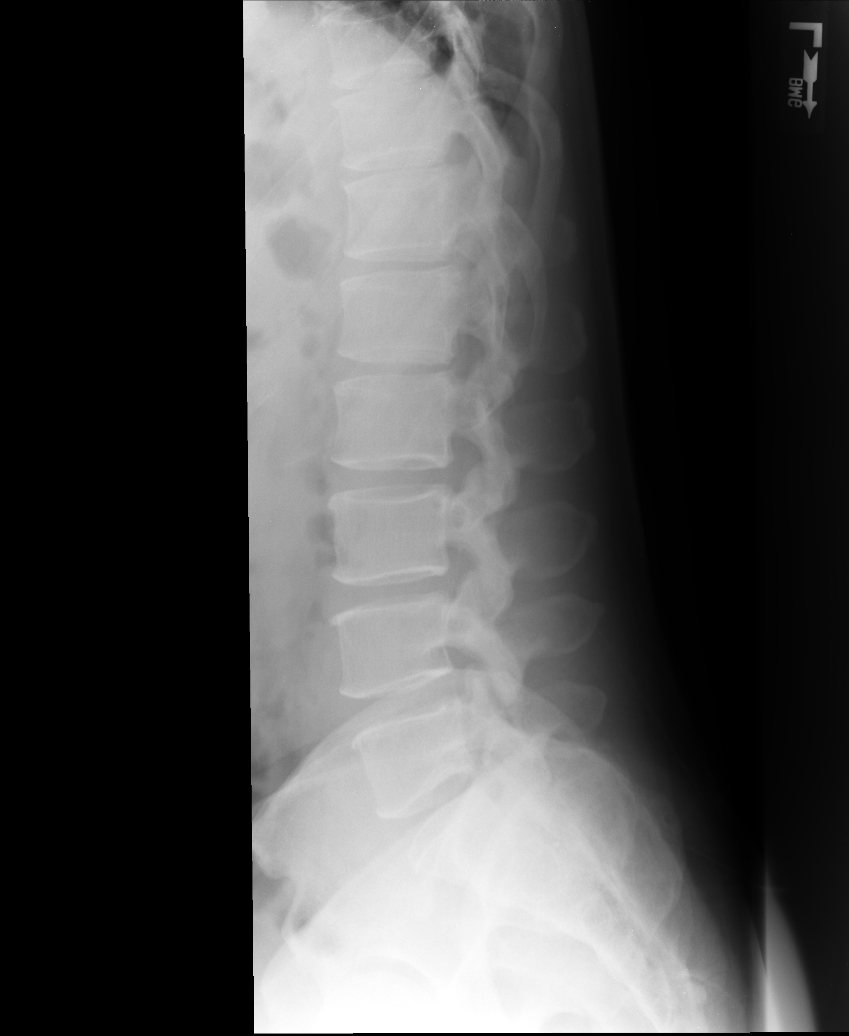

[l5 s1]
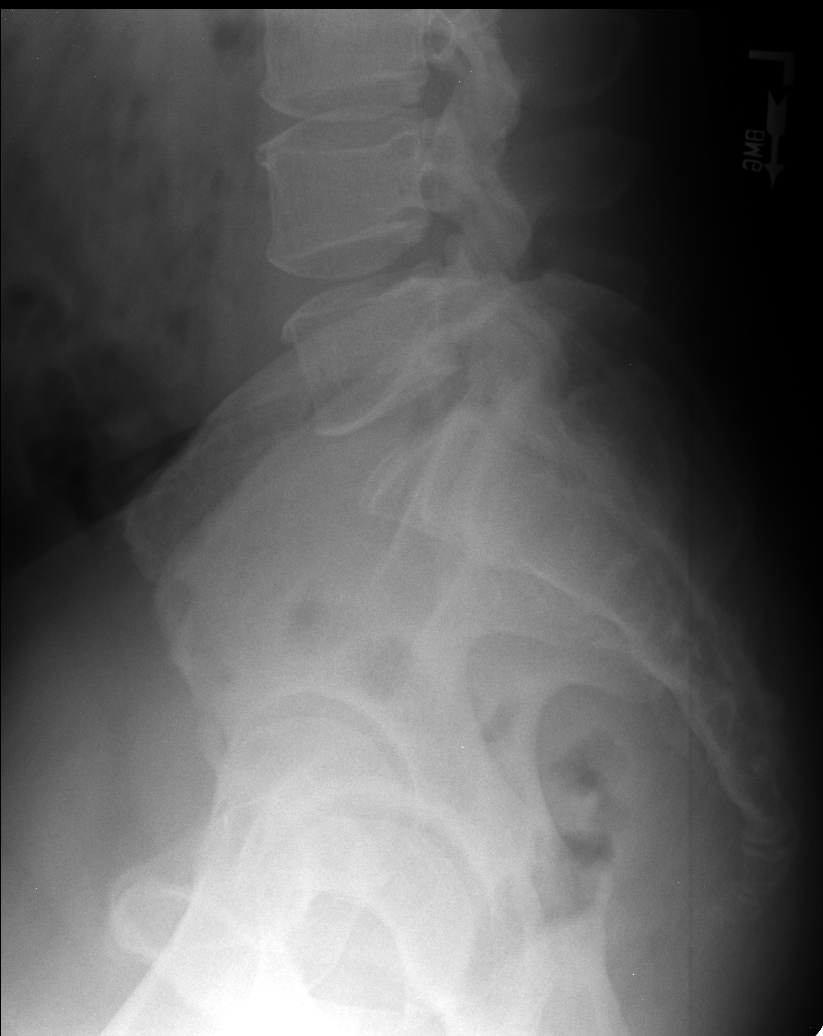

[rpo]
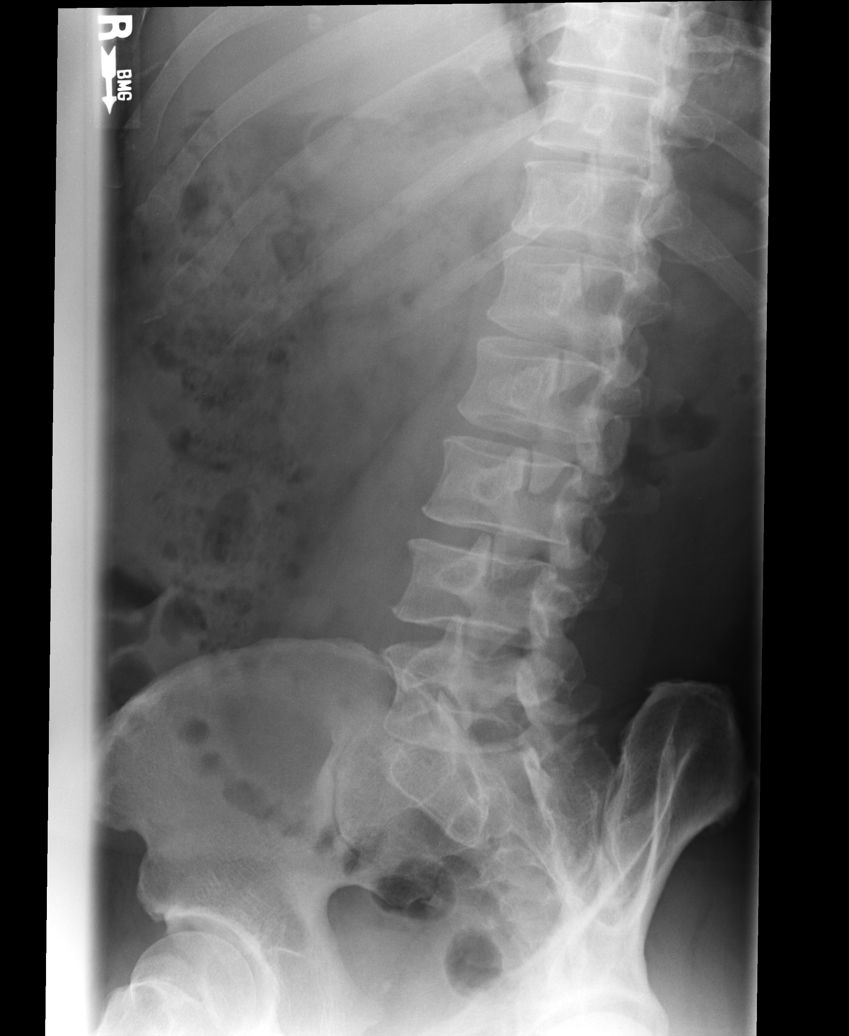

[lpo]
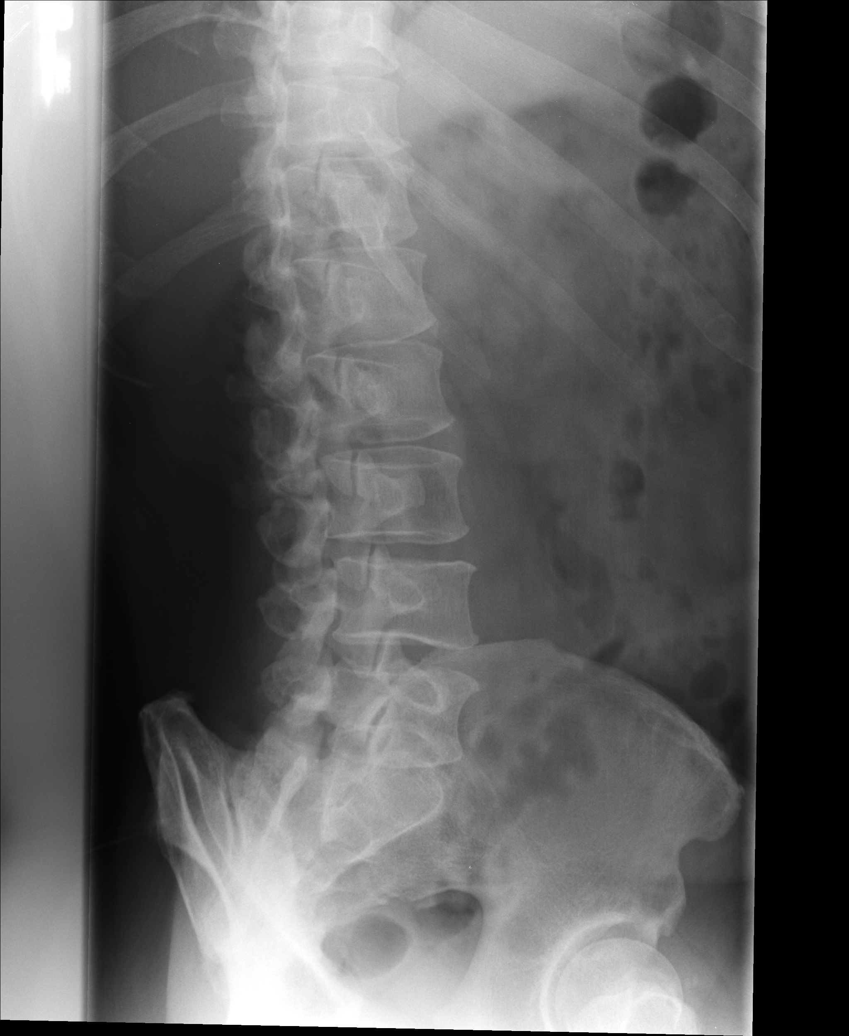

[5 of 5 positions shown; findings below may reference images not displayed]

FINDINGS: No fracture or spondylolisthesis. Mild straightening of the normal
lumbar lordosis. Mild loss of disc height at L3-L4 and L4-L5.
Remaining disc spaces are well preserved. Facet joints are well
preserved. Soft tissues are unremarkable.
IMPRESSION: 1. Mild degenerative changes and mild straightening of the normal
lumbar lordosis. No fracture or acute finding.

## 2016-08-13 ENCOUNTER — Ambulatory Visit (INDEPENDENT_AMBULATORY_CARE_PROVIDER_SITE_OTHER): Payer: Commercial Managed Care - PPO | Admitting: Family Medicine

## 2016-08-13 VITALS — BP 140/68 | HR 78 | Temp 100.0°F | Resp 18 | Ht 65.5 in | Wt 183.0 lb

## 2016-08-13 DIAGNOSIS — J029 Acute pharyngitis, unspecified: Secondary | ICD-10-CM | POA: Diagnosis not present

## 2016-08-13 DIAGNOSIS — R69 Illness, unspecified: Principal | ICD-10-CM

## 2016-08-13 DIAGNOSIS — J111 Influenza due to unidentified influenza virus with other respiratory manifestations: Secondary | ICD-10-CM

## 2016-08-13 MED ORDER — HYDROCODONE-HOMATROPINE 5-1.5 MG/5ML PO SYRP: 5.0000 mL | ORAL_SOLUTION | Freq: Three times a day (TID) | ORAL | 0 refills | Status: DC | PRN

## 2016-08-13 NOTE — Patient Instructions (Addendum)
I think that you do have the flu.  After the first 48 hours, the Tamiflu is not as effective as we discussed.   Honey is good to prevent cough.  If you have a fever, make sure to rest and do not exercise.   You should be feeling better in the next few days.  Continue with the ibuprofen and Tylenol for fever and muscle aches.  If you're not feeling better by this weekend let us know.   Influenza, Adult Influenza ("the flu") is an infection in the lungs, nose, and throat (respiratory tract). It is caused by a virus. The flu causes many common cold symptoms, as well as a high fever and body aches. It can make you feel very sick. The flu spreads easily from person to person (is contagious). Getting a flu shot (influenza vaccination) every year is the best way to prevent the flu. Follow these instructions at home:  Take over-the-counter and prescription medicines only as told by your doctor.  Use a cool mist humidifier to add moisture (humidity) to the air in your home. This can make it easier to breathe.  Rest as needed.  Drink enough fluid to keep your pee (urine) clear or pale yellow.  Cover your mouth and nose when you cough or sneeze.  Wash your hands with soap and water often, especially after you cough or sneeze. If you cannot use soap and water, use hand sanitizer.  Stay home from work or school as told by your doctor. Unless you are visiting your doctor, try to avoid leaving home until your fever has been gone for 24 hours without the use of medicine.  Keep all follow-up visits as told by your doctor. This is important. How is this prevented?  Getting a yearly (annual) flu shot is the best way to avoid getting the flu. You may get the flu shot in late summer, fall, or winter. Ask your doctor when you should get your flu shot.  Wash your hands often or use hand sanitizer often.  Avoid contact with people who are sick during cold and flu season.  Eat healthy foods.  Drink  plenty of fluids.  Get enough sleep.  Exercise regularly. Contact a doctor if:  You get new symptoms.  You have:  Chest pain.  Watery poop (diarrhea).  A fever.  Your cough gets worse.  You start to have more mucus.  You feel sick to your stomach (nauseous).  You throw up (vomit). Get help right away if:  You start to be short of breath or have trouble breathing.  Your skin or nails turn a bluish color.  You have very bad pain or stiffness in your neck.  You get a sudden headache.  You get sudden pain in your face or ear.  You cannot stop throwing up. This information is not intended to replace advice given to you by your health care provider. Make sure you discuss any questions you have with your health care provider. Document Released: 04/08/2008 Document Revised: 12/06/2015 Document Reviewed: 04/24/2015 Elsevier Interactive Patient Education  2017 [employer].     IF you received an x-ray today, you will receive an invoice from [facility]. Please contact [facility] at [phone] with questions or concerns regarding your invoice.   IF you received labwork today, you will receive an invoice from [facility]. Please contact [facility] at [phone] with questions or concerns regarding your invoice.   Our billing staff will not be able to assist you  with questions regarding bills from these companies.  You will be contacted with the lab results as soon as they are available. The fastest way to get your results is to activate your My Chart account. Instructions are located on the last page of this paperwork. If you have not heard from [place] regarding the results in 2 weeks, please contact this office.

## 2016-08-13 NOTE — Progress Notes (Signed)
   Billy Rocha is a 56 y.o. male who presents to [facility] today for flu-like symptoms:  1.  Flu:  Patient began having symptoms on Sunday. He describes initially runny nose, sore throat. The next day began having muscle aches and joint pains. He did try to go for a run that day. He is continue to feel progressively worse as the week has gone on. He has been taking Tylenol and ibuprofen for relief of his fever and muscle aches. He's had no cough. His sore throat is better but still with coryza. He is taking over-the-counter herbal teas tablet the symptoms.  He had his flu shot this past October. His wife and child however not sick.  Eating and drinking well, remaining hydrated.  No documented fevers at home.  No N/V/abd pain. No chest pain  ROS as above.    PMH reviewed. Patient is a nonsmoker.   Past Medical History:  Diagnosis Date  . Arthritis    No past surgical history on file.  Medications reviewed. Current Outpatient Prescriptions  Medication Sig Dispense Refill  . Acetaminophen (TYLENOL PO) Take by mouth.    . cyclobenzaprine (FLEXERIL) 10 MG tablet Take 1 tablet (10 mg total) by mouth 3 (three) times daily as needed for muscle spasms. (Patient not taking: Reported on 08/13/2016) 30 tablet 0  . methocarbamol (ROBAXIN-750) 750 MG tablet Take 1 tablet (750 mg total) by mouth 4 (four) times daily. (Patient not taking: Reported on 08/13/2016) 40 tablet 1  . naproxen sodium (ANAPROX) 550 MG tablet Take 1 tablet (550 mg total) by mouth 2 (two) times daily with a meal. (Patient not taking: Reported on 08/13/2016) 30 tablet 0   No current facility-administered medications for this visit.      Physical Exam:  BP 140/68   Pulse 78   Temp 100 F (37.8 C) (Oral)   Resp 18   Ht 5' 5.5" (1.664 m)   Wt 183 lb (83 kg)   SpO2 96%   BMI 29.99 kg/m  Gen:  Patient sitting on exam table, appears stated age in no acute distress.  Sweaty-appearing.  Head: Normocephalic  atraumatic Eyes: EOMI, PERRL, sclera and conjunctiva non-erythematous Ears:  Canals clear bilaterally.  TMs pearly gray bilaterally without erythema or bulging.   Nose:  Nasal turbinates with exudates, clear, BL Mouth: Mucosa membranes moist. Tonsils +2, nonenlarged, non-erythematous. Neck: No cervical lymphadenopathy noted Heart:  RRR, no murmurs auscultated. Pulm:  Clear to auscultation bilaterally with good air movement.  No wheezes or rales noted.  Abd:  NT Ext:  No LE edema     Assessment and Plan:  1.  Flu-like illness: - Likely influenza. -4 days into his symptoms.  No comorbid conditions.  We'll treat symptomatically.  Analgesics and antipyretics as needed. -He is concerned that he will start coughing. He has occasional headache and coughing make his headache worse. Recommended honey and will also provide hycodan to treat cough as needed.  - FU if no improvement in next week, sooner if worsening.

## 2016-09-12 ENCOUNTER — Ambulatory Visit (INDEPENDENT_AMBULATORY_CARE_PROVIDER_SITE_OTHER): Payer: Commercial Managed Care - PPO | Admitting: Family Medicine

## 2016-09-12 VITALS — BP 124/86 | HR 56 | Temp 98.2°F | Resp 18 | Ht 65.75 in | Wt 174.0 lb

## 2016-09-12 DIAGNOSIS — Z0000 Encounter for general adult medical examination without abnormal findings: Secondary | ICD-10-CM | POA: Diagnosis not present

## 2016-09-12 DIAGNOSIS — Z1211 Encounter for screening for malignant neoplasm of colon: Secondary | ICD-10-CM | POA: Diagnosis not present

## 2016-09-12 DIAGNOSIS — Z1159 Encounter for screening for other viral diseases: Secondary | ICD-10-CM | POA: Diagnosis not present

## 2016-09-12 DIAGNOSIS — Z125 Encounter for screening for malignant neoplasm of prostate: Secondary | ICD-10-CM | POA: Diagnosis not present

## 2016-09-12 DIAGNOSIS — H547 Unspecified visual loss: Secondary | ICD-10-CM | POA: Diagnosis not present

## 2016-09-12 DIAGNOSIS — E663 Overweight: Secondary | ICD-10-CM | POA: Diagnosis not present

## 2016-09-12 DIAGNOSIS — R7303 Prediabetes: Secondary | ICD-10-CM | POA: Diagnosis not present

## 2016-09-12 DIAGNOSIS — Z833 Family history of diabetes mellitus: Secondary | ICD-10-CM | POA: Diagnosis not present

## 2016-09-12 LAB — POCT GLYCOSYLATED HEMOGLOBIN (HGB A1C): Hemoglobin A1C: 6.2

## 2016-09-12 NOTE — Patient Instructions (Addendum)
IF you received an x-ray today, you will receive an invoice from [facility]. Please contact [facility] at [phone] with questions or concerns regarding your invoice.   IF you received labwork today, you will receive an invoice from [facility]. Please contact [facility] at [phone] with questions or concerns regarding your invoice.   Our billing staff will not be able to assist you with questions regarding bills from these companies.  You will be contacted with the lab results as soon as they are available. The fastest way to get your results is to activate your My Chart account. Instructions are located on the last page of this paperwork. If you have not heard from [place] regarding the results in 2 weeks, please contact this office.     Colonoscopy, Adult A colonoscopy is an exam to look at the entire large intestine. During the exam, a lubricated, bendable tube is inserted into the anus and then passed into the rectum, colon, and other parts of the large intestine. A colonoscopy is often done as a part of normal colorectal screening or in response to certain symptoms, such as anemia, persistent diarrhea, abdominal pain, and blood in the stool. The exam can help screen for and diagnose medical problems, including:  Tumors.  Polyps.  Inflammation.  Areas of bleeding. Tell a health care provider about:  Any allergies you have.  All medicines you are taking, including vitamins, herbs, eye drops, creams, and over-the-counter medicines.  Any problems you or family members have had with anesthetic medicines.  Any blood disorders you have.  Any surgeries you have had.  Any medical conditions you have.  Any problems you have had passing stool. What are the risks? Generally, this is a safe procedure. However, problems may occur, including:  Bleeding.  A tear in the intestine.  A reaction to medicines given during the exam.  Infection (rare). What  happens before the procedure? Eating and drinking restrictions  Follow instructions from your health care provider about eating and drinking, which may include:  A few days before the procedure - follow a low-fiber diet. Avoid nuts, seeds, dried fruit, raw fruits, and vegetables.  1-3 days before the procedure - follow a clear liquid diet. Drink only clear liquids, such as clear broth or bouillon, black coffee or tea, clear juice, clear soft drinks or sports drinks, gelatin dessert, and popsicles. Avoid any liquids that contain red or purple dye.  On the day of the procedure - do not eat or drink anything during the 2 hours before the procedure, or within the time period that your health care provider recommends. Bowel prep  If you were prescribed an oral bowel prep to clean out your colon:  Take it as told by your health care provider. Starting the day before your procedure, you will need to drink a large amount of medicated liquid. The liquid will cause you to have multiple loose stools until your stool is almost clear or light green.  If your skin or anus gets irritated from diarrhea, you may use these to relieve the irritation:  Medicated wipes, such as adult wet wipes with aloe and vitamin E.  A skin soothing-product like petroleum jelly.  If you vomit while drinking the bowel prep, take a break for up to 60 minutes and then begin the bowel prep again. If vomiting continues and you cannot take the bowel prep without vomiting, call your health care provider. General instructions   Ask your health care provider  about changing or stopping your regular medicines. This is especially important if you are taking diabetes medicines or blood thinners.  Plan to have someone take you home from the hospital or clinic. What happens during the procedure?  An IV tube may be inserted into one of your veins.  You will be given medicine to help you relax (sedative).  To reduce your risk of  infection:  Your health care team will wash or sanitize their hands.  Your anal area will be washed with soap.  You will be asked to lie on your side with your knees bent.  Your health care provider will lubricate a long, thin, flexible tube. The tube will have a camera and a light on the end.  The tube will be inserted into your anus.  The tube will be gently eased through your rectum and colon.  Air will be delivered into your colon to keep it open. You may feel some pressure or cramping.  The camera will be used to take images during the procedure.  A small tissue sample may be removed from your body to be examined under a microscope (biopsy). If any potential problems are found, the tissue will be sent to a lab for testing.  If small polyps are found, your health care provider may remove them and have them checked for cancer cells.  The tube that was inserted into your anus will be slowly removed. The procedure may vary among health care providers and hospitals. What happens after the procedure?  Your blood pressure, heart rate, breathing rate, and blood oxygen level will be monitored until the medicines you were given have worn off.  Do not drive for 24 hours after the exam.  You may have a small amount of blood in your stool.  You may pass gas and have mild abdominal cramping or bloating due to the air that was used to inflate your colon during the exam.  It is up to you to get the results of your procedure. Ask your health care provider, or the department performing the procedure, when your results will be ready. This information is not intended to replace advice given to you by your health care provider. Make sure you discuss any questions you have with your health care provider. Document Released: 06/27/2000 Document Revised: 04/30/2016 Document Reviewed: 09/11/2015 Elsevier Interactive Patient Education  2017 [employer].

## 2016-09-12 NOTE — Progress Notes (Signed)
Chief Complaint  Patient presents with  . Annual Exam    Subjective:  Billy Rocha is a 56 y.o. male here for a health maintenance visit.  Patient is established pt  overweight Pt reports that he has been exercising and has had weight loss His brother and sister were recently diagnosed with diabetes He has been exercising 3 times a week and walking but is having some knee pains with exercise Wt Readings from Last 3 Encounters:  09/12/16 174 lb (78.9 kg)  08/13/16 183 lb (83 kg)  05/21/15 176 lb (79.8 kg)   Lab Results  Component Value Date   HGBA1C 6.2 09/12/2016     Patient Active Problem List   Diagnosis Date Noted  . Decreased visual acuity 09/12/2016  . Arthritis of both knees 03/31/2014    Past Medical History:  Diagnosis Date  . Arthritis     No past surgical history on file.   Outpatient Medications Prior to Visit  Medication Sig Dispense Refill  . Acetaminophen (TYLENOL PO) Take by mouth.    . cyclobenzaprine (FLEXERIL) 10 MG tablet Take 1 tablet (10 mg total) by mouth 3 (three) times daily as needed for muscle spasms. (Patient not taking: Reported on 08/13/2016) 30 tablet 0  . HYDROcodone-homatropine (HYCODAN) 5-1.5 MG/5ML syrup Take 5 mLs by mouth every 8 (eight) hours as needed for cough. (Patient not taking: Reported on 09/12/2016) 120 mL 0  . methocarbamol (ROBAXIN-750) 750 MG tablet Take 1 tablet (750 mg total) by mouth 4 (four) times daily. (Patient not taking: Reported on 08/13/2016) 40 tablet 1  . naproxen sodium (ANAPROX) 550 MG tablet Take 1 tablet (550 mg total) by mouth 2 (two) times daily with a meal. (Patient not taking: Reported on 08/13/2016) 30 tablet 0   No facility-administered medications prior to visit.     No Known Allergies   Family History  Problem Relation Age of Onset  . Cancer Mother   . Diabetes Mother   . Heart disease Mother   . Diabetes Sister   . Diabetes Brother      Health Habits: Dental Exam: up to  date Eye Exam: not up to date Exercise: 3 times/week on average Current exercise activities: walking/running Diet: balanced  Social History   Social History  . Marital status: Married    Spouse name: N/A  . Number of children: N/A  . Years of education: N/A   Occupational History  . Not on file.   Social History Main Topics  . Smoking status: Never Smoker  . Smokeless tobacco: Never Used  . Alcohol use No  . Drug use: No  . Sexual activity: Not on file   Other Topics Concern  . Not on file   Social History Narrative  . No narrative on file   History  Alcohol Use No   History  Smoking Status  . Never Smoker  Smokeless Tobacco  . Never Used   History  Drug Use No      Health Maintenance: See under health Maintenance activity for review of completion dates as well. Immunization History  Administered Date(s) Administered  . Influenza-Unspecified 05/07/2015  . Tdap 03/31/2014      Depression Screen-PHQ2/9 Depression screen [facility] 2/9 09/12/2016 08/13/2016 05/21/2015 03/26/2015  Decreased Interest 0 0 0 0  Down, Depressed, Hopeless 0 0 0 0  PHQ - 2 Score 0 0 0 0      Depression Severity and Treatment Recommendations:  0-4= None  5-9= Mild / Treatment: Support,  educate to call if worse; return in one month  10-14= Moderate / Treatment: Support, watchful waiting; Antidepressant or Psycotherapy  15-19= Moderately severe / Treatment: Antidepressant OR Psychotherapy  >= 20 = Major depression, severe / Antidepressant AND Psychotherapy    Review of Systems   Review of Systems  Constitutional: Negative for chills, fever and weight loss.  HENT: Negative for ear discharge, ear pain, hearing loss and tinnitus.   Eyes: Negative for blurred vision, double vision and photophobia.  Respiratory: Negative for cough, hemoptysis and sputum production.   Cardiovascular: Negative for chest pain, palpitations, orthopnea and leg swelling.  Gastrointestinal: Negative for  abdominal pain, blood in stool, constipation, diarrhea, nausea and vomiting.  Genitourinary: Negative for dysuria, frequency, hematuria and urgency.  Musculoskeletal: Positive for joint pain. Negative for back pain and neck pain.  Skin: Negative for itching and rash.  Neurological: Negative for dizziness, tingling, tremors, sensory change, speech change and headaches.  Psychiatric/Behavioral: Negative for depression. The patient is not nervous/anxious and does not have insomnia.     See HPI for ROS as well.    Objective:   Vitals:   09/12/16 0931  BP: 124/86  Pulse: (!) 56  Resp: 18  Temp: 98.2 F (36.8 C)  TempSrc: Oral  SpO2: 98%  Weight: 174 lb (78.9 kg)  Height: 5' 5.75" (1.67 m)   Wt Readings from Last 3 Encounters:  09/12/16 174 lb (78.9 kg)  08/13/16 183 lb (83 kg)  05/21/15 176 lb (79.8 kg)    Body mass index is 28.3 kg/m.  Physical Exam  Constitutional: He is oriented to person, place, and time. He appears well-developed and well-nourished.  HENT:  Head: Normocephalic and atraumatic.  Right Ear: External ear normal.  Left Ear: External ear normal.  Nose: Nose normal.  Mouth/Throat: Oropharynx is clear and moist.  Eyes: Conjunctivae and EOM are normal. Pupils are equal, round, and reactive to light. Right eye exhibits no discharge. Left eye exhibits no discharge.  Neck: Normal range of motion. Neck supple. No thyromegaly present.  Cardiovascular: Normal rate, regular rhythm and normal heart sounds.   Pulmonary/Chest: Effort normal and breath sounds normal. No respiratory distress. He has no wheezes. He has no rales.  Abdominal: Soft. Bowel sounds are normal. He exhibits no distension. There is no tenderness.  Musculoskeletal: Normal range of motion. He exhibits no edema.       Right knee: He exhibits normal range of motion, no swelling and no effusion.       Left knee: He exhibits normal range of motion, no swelling and no effusion.  Neurological: He is alert  and oriented to person, place, and time. He has normal reflexes. No cranial nerve deficit.  Skin: Skin is warm. No erythema.  Psychiatric: He has a normal mood and affect. His behavior is normal. Judgment and thought content normal.      Assessment/Plan:   Patient was seen for a health maintenance exam.  Counseled the patient on health maintenance issues. Reviewed her health mainteance schedule and ordered appropriate tests (see orders.) Counseled on regular exercise and weight management. Recommend regular eye exams and dental cleaning.   The following issues were addressed today for health maintenance:   Billy Rocha was seen today for annual exam.  Diagnoses and all orders for this visit:  Health maintenance examination -     Lipid panel -     HCV Ab w/Rflx to Verification -     Basic metabolic panel -  POCT glycosylated hemoglobin (Hb A1C)  Decreased visual acuity- advised pt to follow up with eye exam  Discussed that if he does not have vision screening  Screen for colon cancer- discussed screening Pt agrees to follow up for colonoscopy -     [facility] COLONOSCOPY  Family history of diabetes mellitus in first degree relative- will screen today Discussed limiting carbs (he admits to eating a lot of tortillas) -     POCT glycosylated hemoglobin (Hb A1C)  Overweight- pt already lost 9 pounds since January with lifestyle changes) -     POCT glycosylated hemoglobin (Hb A1C)  Need for hepatitis C screening test -     HCV Ab w/Rflx to Verification  Encounter for prostate cancer screening -     PSA    Return in about 1 week (around 09/19/2016) for Dr. Carlota Raspberry or Dr. Raeford Razor for osteoarthritis in one week.    Body mass index is 28.3 kg/m.:  Discussed the patient's BMI with patient. The BMI body mass index is 28.3 kg/m.     No future appointments.  Patient Instructions       IF you received an x-ray today, you will receive an invoice from [facility]. Please  contact [facility] at [phone] with questions or concerns regarding your invoice.   IF you received labwork today, you will receive an invoice from [facility]. Please contact [facility] at [phone] with questions or concerns regarding your invoice.   Our billing staff will not be able to assist you with questions regarding bills from these companies.  You will be contacted with the lab results as soon as they are available. The fastest way to get your results is to activate your My Chart account. Instructions are located on the last page of this paperwork. If you have not heard from [place] regarding the results in 2 weeks, please contact this office.     Colonoscopy, Adult A colonoscopy is an exam to look at the entire large intestine. During the exam, a lubricated, bendable tube is inserted into the anus and then passed into the rectum, colon, and other parts of the large intestine. A colonoscopy is often done as a part of normal colorectal screening or in response to certain symptoms, such as anemia, persistent diarrhea, abdominal pain, and blood in the stool. The exam can help screen for and diagnose medical problems, including:  Tumors.  Polyps.  Inflammation.  Areas of bleeding. Tell a health care provider about:  Any allergies you have.  All medicines you are taking, including vitamins, herbs, eye drops, creams, and over-the-counter medicines.  Any problems you or family members have had with anesthetic medicines.  Any blood disorders you have.  Any surgeries you have had.  Any medical conditions you have.  Any problems you have had passing stool. What are the risks? Generally, this is a safe procedure. However, problems may occur, including:  Bleeding.  A tear in the intestine.  A reaction to medicines given during the exam.  Infection (rare). What happens before the procedure? Eating and drinking restrictions  Follow instructions from your health care  provider about eating and drinking, which may include:  A few days before the procedure - follow a low-fiber diet. Avoid nuts, seeds, dried fruit, raw fruits, and vegetables.  1-3 days before the procedure - follow a clear liquid diet. Drink only clear liquids, such as clear broth or bouillon, black coffee or tea, clear juice, clear soft drinks or sports drinks, gelatin dessert, and  popsicles. Avoid any liquids that contain red or purple dye.  On the day of the procedure - do not eat or drink anything during the 2 hours before the procedure, or within the time period that your health care provider recommends. Bowel prep  If you were prescribed an oral bowel prep to clean out your colon:  Take it as told by your health care provider. Starting the day before your procedure, you will need to drink a large amount of medicated liquid. The liquid will cause you to have multiple loose stools until your stool is almost clear or light green.  If your skin or anus gets irritated from diarrhea, you may use these to relieve the irritation:  Medicated wipes, such as adult wet wipes with aloe and vitamin E.  A skin soothing-product like petroleum jelly.  If you vomit while drinking the bowel prep, take a break for up to 60 minutes and then begin the bowel prep again. If vomiting continues and you cannot take the bowel prep without vomiting, call your health care provider. General instructions   Ask your health care provider about changing or stopping your regular medicines. This is especially important if you are taking diabetes medicines or blood thinners.  Plan to have someone take you home from the hospital or clinic. What happens during the procedure?  An IV tube may be inserted into one of your veins.  You will be given medicine to help you relax (sedative).  To reduce your risk of infection:  Your health care team will wash or sanitize their hands.  Your anal area will be washed with  soap.  You will be asked to lie on your side with your knees bent.  Your health care provider will lubricate a long, thin, flexible tube. The tube will have a camera and a light on the end.  The tube will be inserted into your anus.  The tube will be gently eased through your rectum and colon.  Air will be delivered into your colon to keep it open. You may feel some pressure or cramping.  The camera will be used to take images during the procedure.  A small tissue sample may be removed from your body to be examined under a microscope (biopsy). If any potential problems are found, the tissue will be sent to a lab for testing.  If small polyps are found, your health care provider may remove them and have them checked for cancer cells.  The tube that was inserted into your anus will be slowly removed. The procedure may vary among health care providers and hospitals. What happens after the procedure?  Your blood pressure, heart rate, breathing rate, and blood oxygen level will be monitored until the medicines you were given have worn off.  Do not drive for 24 hours after the exam.  You may have a small amount of blood in your stool.  You may pass gas and have mild abdominal cramping or bloating due to the air that was used to inflate your colon during the exam.  It is up to you to get the results of your procedure. Ask your health care provider, or the department performing the procedure, when your results will be ready. This information is not intended to replace advice given to you by your health care provider. Make sure you discuss any questions you have with your health care provider. Document Released: 06/27/2000 Document Revised: 04/30/2016 Document Reviewed: 09/11/2015 Elsevier Interactive Patient Education  2017 [employer]

## 2016-09-13 LAB — BASIC METABOLIC PANEL
BUN / CREAT RATIO: 12 (ref 9–20)
BUN: 13 mg/dL (ref 6–24)
CO2: 20 mmol/L (ref 18–29)
CREATININE: 1.05 mg/dL (ref 0.76–1.27)
Calcium: 9.2 mg/dL (ref 8.7–10.2)
Chloride: 99 mmol/L (ref 96–106)
GFR calc non Af Amer: 80 mL/min/{1.73_m2} (ref >59)
GFR, EST AFRICAN AMERICAN: 92 mL/min/{1.73_m2} (ref >59)
Glucose: 90 mg/dL (ref 65–99)
Potassium: 4.7 mmol/L (ref 3.5–5.2)
Sodium: 137 mmol/L (ref 134–144)

## 2016-09-13 LAB — LIPID PANEL
CHOL/HDL RATIO: 2.7 ratio (ref 0.0–5.0)
Cholesterol, Total: 168 mg/dL (ref 100–199)
HDL: 63 mg/dL (ref >39)
LDL CALC: 94 mg/dL (ref 0–99)
Triglycerides: 53 mg/dL (ref 0–149)
VLDL Cholesterol Cal: 11 mg/dL (ref 5–40)

## 2016-09-13 LAB — PSA: PROSTATE SPECIFIC AG, SERUM: 1.1 ng/mL (ref 0.0–4.0)

## 2016-09-13 LAB — HCV AB W/RFLX TO VERIFICATION: HCV Ab: <0.1 s/co ratio (ref 0.0–0.9)

## 2016-09-13 LAB — HCV INTERPRETATION

## 2016-09-17 ENCOUNTER — Encounter: Payer: Self-pay | Admitting: Family Medicine

## 2016-09-18 ENCOUNTER — Ambulatory Visit (INDEPENDENT_AMBULATORY_CARE_PROVIDER_SITE_OTHER): Payer: Commercial Managed Care - PPO | Admitting: Family Medicine

## 2016-09-18 ENCOUNTER — Encounter: Payer: Self-pay | Admitting: Family Medicine

## 2016-09-18 VITALS — BP 103/66 | HR 65 | Temp 98.7°F | Resp 18 | Ht 65.75 in | Wt 176.0 lb

## 2016-09-18 DIAGNOSIS — M25562 Pain in left knee: Secondary | ICD-10-CM

## 2016-09-18 DIAGNOSIS — M25521 Pain in right elbow: Secondary | ICD-10-CM | POA: Diagnosis not present

## 2016-09-18 DIAGNOSIS — M2550 Pain in unspecified joint: Secondary | ICD-10-CM | POA: Diagnosis not present

## 2016-09-18 DIAGNOSIS — M7712 Lateral epicondylitis, left elbow: Secondary | ICD-10-CM

## 2016-09-18 DIAGNOSIS — M25561 Pain in right knee: Secondary | ICD-10-CM

## 2016-09-18 DIAGNOSIS — M25512 Pain in left shoulder: Secondary | ICD-10-CM | POA: Diagnosis not present

## 2016-09-18 DIAGNOSIS — M25522 Pain in left elbow: Secondary | ICD-10-CM

## 2016-09-18 NOTE — Progress Notes (Addendum)
By signing my name below, I, Mesha Guinyard, attest that this documentation has been prepared under the direction and in the presence of Merri Ray, MD.  Electronically Signed: Verlee Monte, Medical Scribe. 09/18/16. 5:27 PM.  Subjective:    Patient ID: Billy Rocha, male    DOB: [birth year], 56 y.o.   MRN: [id number]  HPI Chief Complaint  Patient presents with  . joint pain    arms and elbows    HPI Comments: Billy Rocha is a 56 y.o. male who presents to the [facility] complaining of arthralgias in his bilateral elbows, and popliteal fossa area. Billy Rocha was last seen 6 days ago. His previous notes were reviewed.  Bilateral Elbow: Onset the last couple of months. Reports some soreness that last a few seconds in the crease of his elbow with pressure when it's touched. Pain occurs once a week and can be triggered when he lifts up. Pt does 20 pull ups and runs 3x a week.  Pt is left hand dominant and works as a Programme researcher, broadcasting/film/video. Denies pain with serving or running, weakness in his arms, or edema.  Bilateral Knee Pain: Onset 5-6 years, and he saw a [employer] 4 years ago. Reports intermittent swelling and pain for 1-2 days in different parts of the knee and could be triggered after running. Takes tylenol for relief of his sxs in a few days. Pt could go 2 months to 2 weeks in between his swelling. He's had x-rays done 4 years ago that showed arthritis without bone to bone grinding and wasn't recommend surgery. Pt drinks a glass of wine once every 2 weeks. Pt played soccer as a child without any major injuries. Denies PMHx of gout, redness, and edema in his feet.  Left Shoulder: Reports one episode of left shoulder pain onset 4 days ago and last 30 - 45 seconds. Pt was taking a shower at the time of the occurrence.No recurrence of pain, normal use of shoulder at this time.  Patient Active Problem List   Diagnosis Date Noted  .  Decreased visual acuity 09/12/2016  . Arthritis of both knees 03/31/2014   Past Medical History:  Diagnosis Date  . Arthritis    History reviewed. No pertinent surgical history. No Known Allergies Prior to Admission medications   Medication Sig Start Date End Date Taking? Authorizing Provider  glucosamine-chondroitin 500-400 MG tablet Take 1 tablet by mouth 3 (three) times daily.   Yes Historical Provider, MD   Social History   Social History  . Marital status: Married    Spouse name: N/A  . Number of children: N/A  . Years of education: N/A   Occupational History  . Not on file.   Social History Main Topics  . Smoking status: Never Smoker  . Smokeless tobacco: Never Used  . Alcohol use No  . Drug use: No  . Sexual activity: Not on file   Other Topics Concern  . Not on file   Social History Narrative  . No narrative on file   Review of Systems  Musculoskeletal: Positive for arthralgias and joint swelling.  Skin: Negative for color change.  Neurological: Negative for weakness.    Objective:  Physical Exam  Constitutional: He appears well-developed and well-nourished. No distress.  HENT:  Head: Normocephalic and atraumatic.  Eyes: Conjunctivae are normal.  Neck: Neck supple.  Cardiovascular: Normal rate.   Pulmonary/Chest: Effort normal.  Musculoskeletal:  Bilateral Knee Exam: No effusion Skin intact FROM negative varus  and valgus negative Lachman Negative McMurry Minimal crepitus of the left Elbows: Right elbow FROM Left arm ROM No bony tenderness Tender along the lateral epicondyl of the left elbow but not tenderness Right elbow non tender Antecubital fossa non tender in his elbows No rash Skin intact Left shoulder: FROM no bony tenderness Negative Neer Negative O'Brien's Full RTC strength Negative cross over   Neurological: He is alert.  Skin: Skin is warm and dry.  Psychiatric: He has a normal mood and affect. His behavior is normal.    Nursing note and vitals reviewed.   Vitals:   09/18/16 1650  BP: 103/66  Pulse: 65  Resp: 18  Temp: 98.7 F (37.1 C)  TempSrc: Oral  SpO2: 97%  Weight: 176 lb (79.8 kg)  Height: 5' 5.75" (1.67 m)  Body mass index is 28.62 kg/m. Assessment & Plan:    Billy Rocha is a 56 y.o. male Polyarthralgia - Plan: Sedimentation Rate, Uric Acid  - Suspected separate issues, but will check for possible polyarthropathy cause with sedimentation rate, and uric acid for gout. RTC precautions if persistent/recurrent polyarthralgias as may need rheumatology follow-up.  Lateral epicondylitis of left elbow - Plan: Brace application Pain of both elbows  - Intermittent elbow pain, right elbow without concerning findings on exam. Left elbow likely let her epicondylitis cysts. Also discussing his symptoms, type of work, and exercise, may be experiencing some insertional tendinitis from biceps and other muscles into elbow. Current exam only suspicious for her epicondylitis is at this time.  -Counterforce bracing discussed, cut back on pull-ups and decrease offending activities, handout given, and home exercise program for lateral epicondylitis. Recheck in 6 weeks. Sooner if worse.   Acute pain of both knees  - Reassuring exam at present. Possible degenerative changes, versus degenerative meniscus issues. No significant debility at this time. \  -check uric acid as above but less likely gout.  -advised to follow-up when he does have specific pains and swelling as no swelling was seen on exam today.   -Meloxicam if needed for acute flares, but intermittent dosing discussed.  Risks/side effects discussed. Recheck in 6 weeks, sooner if worse.  Acute pain of left shoulder  - Reassuring exam at present, and asymptomatic. RTC precautions if recurrence of pain.  Meds ordered this encounter  Medications  . glucosamine-chondroitin 500-400 MG tablet    Sig: Take 1 tablet by mouth 3 (three) times  daily.   Patient Instructions    Left elbow pain appears to be tennis elbow. See information below. Use counterforce brace while working, and decrease amount of pull-ups for now. Exercises as below. Stop if these cause more pain. Recheck in 6 weeks.  Knee pain may be arthritis or wear of meniscus as well.  Exam reassuring today.  I will check gout test and other inflammation test, but suspect those will be ok. Ok to try meloxicam if needed for pain(do not combine with other over the counter pain relievers except tylenol is ok).  Recheck in 6 weeks - sooner if worse sooner.   Return to the clinic or go to the nearest emergency room if any of your symptoms worsen or new symptoms occur.    Knee Pain, Adult Knee pain in adults is common. It can be caused by many things, including:  Arthritis.  A fluid-filled sac (cyst) or growth in your knee.  An infection in your knee.  An injury that will not heal.  Damage, swelling, or irritation of the tissues that support  your knee. Knee pain is usually not a sign of a serious problem. The pain may go away on its own with time and rest. If it does not, a health care provider may order tests to find the cause of the pain. These may include:  Imaging tests, such as an X-ray, MRI, or ultrasound.  Joint aspiration. In this test, fluid is removed from the knee.  Arthroscopy. In this test, a lighted tube is inserted into knee and an image is projected onto a TV screen.  A biopsy. In this test, a sample of tissue is removed from the body and studied under a microscope. Follow these instructions at home: Pay attention to any changes in your symptoms. Take these actions to relieve your pain. Activity   Rest your knee.  Do not do things that cause pain or make pain worse.  Avoid high-impact activities or exercises, such as running, jumping rope, or doing jumping jacks. General instructions   Take over-the-counter and prescription medicines only as  told by your health care provider.  Raise (elevate) your knee above the level of your heart when you are sitting or lying down.  Sleep with a pillow under your knee.  If directed, apply ice to the knee:  Put ice in a plastic bag.  Place a towel between your skin and the bag.  Leave the ice on for 20 minutes, 2-3 times a day.  Ask your health care provider if you should wear an elastic knee support.  Lose weight if you are overweight. Extra weight can put pressure on your knee.  Do not use any products that contain nicotine or tobacco, such as cigarettes and e-cigarettes. Smoking may slow the healing of any bone and joint problems that you may have. If you need help quitting, ask your health care provider. Contact a health care provider if:  Your knee pain continues, changes, or gets worse.  You have a fever along with knee pain.  Your knee buckles or locks up.  Your knee swells, and the swelling becomes worse. Get help right away if:  Your knee feels warm to the touch.  You cannot move your knee.  You have severe pain in your knee.  You have chest pain.  You have trouble breathing. Summary  Knee pain in adults is common. It can be caused by many things, including, arthritis, infection, cysts, or injury.  Knee pain is usually not a sign of a serious problem, but if it does not go away, a health care provider may perform tests to know the cause of the pain.  Pay attention to any changes in your symptoms. Relieve your pain with rest, medicines, light activity, and use of ice.  Get help if your pain continues or becomes very severe, or if your knee buckles or locks up, or if you have chest pain or trouble breathing. This information is not intended to replace advice given to you by your health care provider. Make sure you discuss any questions you have with your health care provider. Document Released: 04/27/2007 Document Revised: 06/20/2016 Document Reviewed:  06/20/2016 Elsevier Interactive Patient Education  2017 [employer]    Tennis Elbow Tennis elbow (lateral epicondylitis) is inflammation of the outer tendons of your forearm close to your elbow. Your tendons attach your muscles to your bones. The outer tendons of your forearm are used to extend your wrist, and they attach on the outside part of your elbow. Tennis elbow is often found in people who  play tennis, but anyone may get the condition from repeatedly extending the wrist or turning the forearm. What are the causes? This condition is caused by repeatedly extending your wrist and using your hands. It can result from sports or work that requires repetitive forearm movements. Tennis elbow may also be caused by an injury. What increases the risk? You have a higher risk of developing tennis elbow if you play tennis or another racquet sport. You also have a higher risk if you frequently use your hands for work. This condition is also more likely to develop in:  Musicians.  Carpenters, painters, and plumbers.  Cooks.  Cashiers.  People who work in [employer].  Architect workers.  Butchers.  People who use computers. What are the signs or symptoms? Symptoms of this condition include:  Pain and tenderness in your forearm and the outer part of your elbow. You may only feel the pain when you use your arm, or you may feel it even when you are not using your arm.  A burning feeling that runs from your elbow through your arm.  Weak grip in your hands. How is this diagnosed? This condition may be diagnosed by medical history and physical exam. You may also have other tests, including:  X-rays.  MRI. How is this treated? Your health care provider will recommend lifestyle adjustments, such as resting and icing your arm. Treatment may also include:  Medicines for inflammation. This may include shots of cortisone if your pain continues.  Physical therapy. This may include  massage or exercises.  An elbow brace. Surgery may eventually be recommended if your pain does not go away with treatment. Follow these instructions at home: Activity   Rest your elbow and wrist as directed by your health care provider. Try to avoid any activities that caused the problem until your health care provider says that you can do them again.  If a physical therapist teaches you exercises, do all of them as directed.  If you lift an object, lift it with your palm facing upward. This lowers the stress on your elbow. Lifestyle   If your tennis elbow is caused by sports, check your equipment and make sure that:  You are using it correctly.  It is the best fit for you.  If your tennis elbow is caused by work, take breaks frequently, if you are able. Talk with your manager about how to best perform tasks in a way that is safe.  If your tennis elbow is caused by computer use, talk with your manager about any changes that can be made to your work environment. General instructions   If directed, apply ice to the painful area:  Put ice in a plastic bag.  Place a towel between your skin and the bag.  Leave the ice on for 20 minutes, 2-3 times per day.  Take medicines only as directed by your health care provider.  If you were given a brace, wear it as directed by your health care provider.  Keep all follow-up visits as directed by your health care provider. This is important. Contact a health care provider if:  Your pain does not get better with treatment.  Your pain gets worse.  You have numbness or weakness in your forearm, hand, or fingers. This information is not intended to replace advice given to you by your health care provider. Make sure you discuss any questions you have with your health care provider. Document Released: 06/30/2005 Document Revised: 02/28/2016 Document Reviewed:  06/26/2014 Elsevier Interactive Patient Education  2017 [employer] Ask your health care provider which exercises are safe for you. Do exercises exactly as told by your health care provider and adjust them as directed. It is normal to feel mild stretching, pulling, tightness, or discomfort as you do these exercises, but you should stop right away if you feel sudden pain or your pain gets worse. Do not begin these exercises until told by your health care provider. Stretching and range of motion exercises These exercises warm up your muscles and joints and improve the movement and flexibility of your elbow. These exercises also help to relieve pain, numbness, and tingling. Exercise A: Wrist extensor stretch  1. Extend your left / right elbow with your fingers pointing down. 2. Gently pull the palm of your left / right hand toward you until you feel a gentle stretch on the top of your forearm. 3. To increase the stretch, push your left / right hand toward the outer edge or pinkie side of your forearm. 4. Hold this position for __________ seconds. Repeat __________ times. Complete this exercise __________ times a day. If directed by your health care provider, repeat this stretch except do it with a bent elbow this time. Exercise B: Wrist flexor stretch   1. Extend your left / right elbow and turn your palm upward. 2. Gently pull your left / right palm and fingertips back so your wrist extends and your fingers point more toward the ground. 3. You should feel a gentle stretch on the inside of your forearm. 4. Hold this position for __________ seconds. Repeat __________ times. Complete this exercise __________ times a day. If directed by your health care provider, repeat this stretch except do it with a bent elbow this time. Strengthening exercises These exercises build strength and endurance in your elbow. Endurance is the ability to use your muscles for a long time, even after they get tired. Exercise C: Wrist extensors   1. Sit with your left / right  forearm palm-down and fully supported on a table or countertop. Your elbow should be resting below the height of your shoulder. 2. Let your left / right wrist extend over the edge of the surface. 3. Loosely hold a __________ weight or a piece of rubber exercise band or tubing in your left / right hand. Slowly curl your left / right hand up toward your forearm. If you are using band or tubing, hold the band or tubing in place with your other hand to provide resistance. 4. Hold this position for __________ seconds. 5. Slowly return to the starting position. Repeat __________ times. Complete this exercise __________ times a day. Exercise D: Radial deviators   1. Stand with a __________ weight in your left / righthand. Or, sit while holding a rubber exercise band or tubing with your other arm supported on a table or countertop. Position your hand so your thumb is on top. 2. Raise your hand upward in front of you so your thumb travels toward your forearm, or pull up on the rubber tubing. 3. Hold this position for __________ seconds. 4. Slowly return to the starting position. Repeat __________ times. Complete this exercise __________ times a day. Exercise E: Eccentric wrist extensors  1. Sit with your left / right forearm palm-down and fully supported on a table or countertop. Your elbow should be resting below the height of your shoulder. 2. If told by your health care provider, hold a  __________ weight in your hand. 3. Let your left / right wrist extend over the edge of the surface. 4. Use your other hand to lift up your left / right hand toward your forearm. Keep your forearm on the table. 5. Using only the muscles in your left / right hand, slowly lower your hand back down to the starting position. Repeat __________ times. Complete this exercise __________ times a day. This information is not intended to replace advice given to you by your health care provider. Make sure you discuss any questions  you have with your health care provider. Document Released: 06/30/2005 Document Revised: 03/05/2016 Document Reviewed: 03/29/2015 Elsevier Interactive Patient Education  2017 [employer].    IF you received an x-ray today, you will receive an invoice from [facility]. Please contact [facility] at [phone] with questions or concerns regarding your invoice.   IF you received labwork today, you will receive an invoice from [facility]. Please contact [facility] at [phone] with questions or concerns regarding your invoice.   Our billing staff will not be able to assist you with questions regarding bills from these companies.  You will be contacted with the lab results as soon as they are available. The fastest way to get your results is to activate your My Chart account. Instructions are located on the last page of this paperwork. If you have not heard from [place] regarding the results in 2 weeks, please contact this office.      I personally performed the services described in this documentation, which was scribed in my presence. The recorded information has been reviewed and considered for accuracy and completeness, addended by me as needed, and agree with information above.  Signed,   Merri Ray, MD [facility] [facility].  09/19/16 12:24 PM

## 2016-09-18 NOTE — Patient Instructions (Addendum)
Left elbow pain appears to be tennis elbow. See information below. Use counterforce brace while working, and decrease amount of pull-ups for now. Exercises as below. Stop if these cause more pain. Recheck in 6 weeks.  Knee pain may be arthritis or wear of meniscus as well.  Exam reassuring today.  I will check gout test and other inflammation test, but suspect those will be ok. Ok to try meloxicam if needed for pain(do not combine with other over the counter pain relievers except tylenol is ok).  Recheck in 6 weeks - sooner if worse sooner.   Return to the clinic or go to the nearest emergency room if any of your symptoms worsen or new symptoms occur.    Knee Pain, Adult Knee pain in adults is common. It can be caused by many things, including:  Arthritis.  A fluid-filled sac (cyst) or growth in your knee.  An infection in your knee.  An injury that will not heal.  Damage, swelling, or irritation of the tissues that support your knee. Knee pain is usually not a sign of a serious problem. The pain may go away on its own with time and rest. If it does not, a health care provider may order tests to find the cause of the pain. These may include:  Imaging tests, such as an X-ray, MRI, or ultrasound.  Joint aspiration. In this test, fluid is removed from the knee.  Arthroscopy. In this test, a lighted tube is inserted into knee and an image is projected onto a TV screen.  A biopsy. In this test, a sample of tissue is removed from the body and studied under a microscope. Follow these instructions at home: Pay attention to any changes in your symptoms. Take these actions to relieve your pain. Activity   Rest your knee.  Do not do things that cause pain or make pain worse.  Avoid high-impact activities or exercises, such as running, jumping rope, or doing jumping jacks. General instructions   Take over-the-counter and prescription medicines only as told by your health care  provider.  Raise (elevate) your knee above the level of your heart when you are sitting or lying down.  Sleep with a pillow under your knee.  If directed, apply ice to the knee:  Put ice in a plastic bag.  Place a towel between your skin and the bag.  Leave the ice on for 20 minutes, 2-3 times a day.  Ask your health care provider if you should wear an elastic knee support.  Lose weight if you are overweight. Extra weight can put pressure on your knee.  Do not use any products that contain nicotine or tobacco, such as cigarettes and e-cigarettes. Smoking may slow the healing of any bone and joint problems that you may have. If you need help quitting, ask your health care provider. Contact a health care provider if:  Your knee pain continues, changes, or gets worse.  You have a fever along with knee pain.  Your knee buckles or locks up.  Your knee swells, and the swelling becomes worse. Get help right away if:  Your knee feels warm to the touch.  You cannot move your knee.  You have severe pain in your knee.  You have chest pain.  You have trouble breathing. Summary  Knee pain in adults is common. It can be caused by many things, including, arthritis, infection, cysts, or injury.  Knee pain is usually not a sign of a serious problem,  but if it does not go away, a health care provider may perform tests to know the cause of the pain.  Pay attention to any changes in your symptoms. Relieve your pain with rest, medicines, light activity, and use of ice.  Get help if your pain continues or becomes very severe, or if your knee buckles or locks up, or if you have chest pain or trouble breathing. This information is not intended to replace advice given to you by your health care provider. Make sure you discuss any questions you have with your health care provider. Document Released: 04/27/2007 Document Revised: 06/20/2016 Document Reviewed: 06/20/2016 Elsevier Interactive  Patient Education  2017 [employer]    Tennis Elbow Tennis elbow (lateral epicondylitis) is inflammation of the outer tendons of your forearm close to your elbow. Your tendons attach your muscles to your bones. The outer tendons of your forearm are used to extend your wrist, and they attach on the outside part of your elbow. Tennis elbow is often found in people who play tennis, but anyone may get the condition from repeatedly extending the wrist or turning the forearm. What are the causes? This condition is caused by repeatedly extending your wrist and using your hands. It can result from sports or work that requires repetitive forearm movements. Tennis elbow may also be caused by an injury. What increases the risk? You have a higher risk of developing tennis elbow if you play tennis or another racquet sport. You also have a higher risk if you frequently use your hands for work. This condition is also more likely to develop in:  Musicians.  Carpenters, painters, and plumbers.  Cooks.  Cashiers.  People who work in [employer].  Architect workers.  Butchers.  People who use computers. What are the signs or symptoms? Symptoms of this condition include:  Pain and tenderness in your forearm and the outer part of your elbow. You may only feel the pain when you use your arm, or you may feel it even when you are not using your arm.  A burning feeling that runs from your elbow through your arm.  Weak grip in your hands. How is this diagnosed? This condition may be diagnosed by medical history and physical exam. You may also have other tests, including:  X-rays.  MRI. How is this treated? Your health care provider will recommend lifestyle adjustments, such as resting and icing your arm. Treatment may also include:  Medicines for inflammation. This may include shots of cortisone if your pain continues.  Physical therapy. This may include massage or exercises.  An elbow  brace. Surgery may eventually be recommended if your pain does not go away with treatment. Follow these instructions at home: Activity   Rest your elbow and wrist as directed by your health care provider. Try to avoid any activities that caused the problem until your health care provider says that you can do them again.  If a physical therapist teaches you exercises, do all of them as directed.  If you lift an object, lift it with your palm facing upward. This lowers the stress on your elbow. Lifestyle   If your tennis elbow is caused by sports, check your equipment and make sure that:  You are using it correctly.  It is the best fit for you.  If your tennis elbow is caused by work, take breaks frequently, if you are able. Talk with your manager about how to best perform tasks in a way that is safe.  If your tennis elbow is caused by computer use, talk with your manager about any changes that can be made to your work environment. General instructions   If directed, apply ice to the painful area:  Put ice in a plastic bag.  Place a towel between your skin and the bag.  Leave the ice on for 20 minutes, 2-3 times per day.  Take medicines only as directed by your health care provider.  If you were given a brace, wear it as directed by your health care provider.  Keep all follow-up visits as directed by your health care provider. This is important. Contact a health care provider if:  Your pain does not get better with treatment.  Your pain gets worse.  You have numbness or weakness in your forearm, hand, or fingers. This information is not intended to replace advice given to you by your health care provider. Make sure you discuss any questions you have with your health care provider. Document Released: 06/30/2005 Document Revised: 02/28/2016 Document Reviewed: 06/26/2014 Elsevier Interactive Patient Education  2017 [place] Ask your health care  provider which exercises are safe for you. Do exercises exactly as told by your health care provider and adjust them as directed. It is normal to feel mild stretching, pulling, tightness, or discomfort as you do these exercises, but you should stop right away if you feel sudden pain or your pain gets worse. Do not begin these exercises until told by your health care provider. Stretching and range of motion exercises These exercises warm up your muscles and joints and improve the movement and flexibility of your elbow. These exercises also help to relieve pain, numbness, and tingling. Exercise A: Wrist extensor stretch  1. Extend your left / right elbow with your fingers pointing down. 2. Gently pull the palm of your left / right hand toward you until you feel a gentle stretch on the top of your forearm. 3. To increase the stretch, push your left / right hand toward the outer edge or pinkie side of your forearm. 4. Hold this position for __________ seconds. Repeat __________ times. Complete this exercise __________ times a day. If directed by your health care provider, repeat this stretch except do it with a bent elbow this time. Exercise B: Wrist flexor stretch   1. Extend your left / right elbow and turn your palm upward. 2. Gently pull your left / right palm and fingertips back so your wrist extends and your fingers point more toward the ground. 3. You should feel a gentle stretch on the inside of your forearm. 4. Hold this position for __________ seconds. Repeat __________ times. Complete this exercise __________ times a day. If directed by your health care provider, repeat this stretch except do it with a bent elbow this time. Strengthening exercises These exercises build strength and endurance in your elbow. Endurance is the ability to use your muscles for a long time, even after they get tired. Exercise C: Wrist extensors   1. Sit with your left / right forearm palm-down and fully  supported on a table or countertop. Your elbow should be resting below the height of your shoulder. 2. Let your left / right wrist extend over the edge of the surface. 3. Loosely hold a __________ weight or a piece of rubber exercise band or tubing in your left / right hand. Slowly curl your left / right hand up toward your forearm. If you are using band or  tubing, hold the band or tubing in place with your other hand to provide resistance. 4. Hold this position for __________ seconds. 5. Slowly return to the starting position. Repeat __________ times. Complete this exercise __________ times a day. Exercise D: Radial deviators   1. Stand with a __________ weight in your left / righthand. Or, sit while holding a rubber exercise band or tubing with your other arm supported on a table or countertop. Position your hand so your thumb is on top. 2. Raise your hand upward in front of you so your thumb travels toward your forearm, or pull up on the rubber tubing. 3. Hold this position for __________ seconds. 4. Slowly return to the starting position. Repeat __________ times. Complete this exercise __________ times a day. Exercise E: Eccentric wrist extensors  1. Sit with your left / right forearm palm-down and fully supported on a table or countertop. Your elbow should be resting below the height of your shoulder. 2. If told by your health care provider, hold a __________ weight in your hand. 3. Let your left / right wrist extend over the edge of the surface. 4. Use your other hand to lift up your left / right hand toward your forearm. Keep your forearm on the table. 5. Using only the muscles in your left / right hand, slowly lower your hand back down to the starting position. Repeat __________ times. Complete this exercise __________ times a day. This information is not intended to replace advice given to you by your health care provider. Make sure you discuss any questions you have with your health  care provider. Document Released: 06/30/2005 Document Revised: 03/05/2016 Document Reviewed: 03/29/2015 Elsevier Interactive Patient Education  2017 [employer].    IF you received an x-ray today, you will receive an invoice from [facility]. Please contact [facility] at [phone] with questions or concerns regarding your invoice.   IF you received labwork today, you will receive an invoice from [facility]. Please contact [facility] at [phone] with questions or concerns regarding your invoice.   Our billing staff will not be able to assist you with questions regarding bills from these companies.  You will be contacted with the lab results as soon as they are available. The fastest way to get your results is to activate your My Chart account. Instructions are located on the last page of this paperwork. If you have not heard from [place] regarding the results in 2 weeks, please contact this office.

## 2016-09-19 LAB — URIC ACID: URIC ACID: 5.9 mg/dL (ref 3.7–8.6)

## 2016-09-19 LAB — SEDIMENTATION RATE: Sed Rate: 3 mm/hr (ref 0–30)

## 2017-08-20 DIAGNOSIS — J069 Acute upper respiratory infection, unspecified: Secondary | ICD-10-CM | POA: Diagnosis not present

## 2017-08-20 DIAGNOSIS — J019 Acute sinusitis, unspecified: Secondary | ICD-10-CM | POA: Diagnosis not present

## 2017-10-06 DIAGNOSIS — R0981 Nasal congestion: Secondary | ICD-10-CM | POA: Diagnosis not present

## 2017-10-06 DIAGNOSIS — R05 Cough: Secondary | ICD-10-CM | POA: Diagnosis not present

## 2017-10-06 DIAGNOSIS — R0982 Postnasal drip: Secondary | ICD-10-CM | POA: Diagnosis not present

## 2017-12-16 DIAGNOSIS — K529 Noninfective gastroenteritis and colitis, unspecified: Secondary | ICD-10-CM | POA: Diagnosis not present

## 2018-03-29 DIAGNOSIS — M5386 Other specified dorsopathies, lumbar region: Secondary | ICD-10-CM | POA: Diagnosis not present

## 2018-03-29 DIAGNOSIS — M9903 Segmental and somatic dysfunction of lumbar region: Secondary | ICD-10-CM | POA: Diagnosis not present

## 2018-03-29 DIAGNOSIS — M9904 Segmental and somatic dysfunction of sacral region: Secondary | ICD-10-CM | POA: Diagnosis not present

## 2018-03-30 DIAGNOSIS — M5386 Other specified dorsopathies, lumbar region: Secondary | ICD-10-CM | POA: Diagnosis not present

## 2018-03-30 DIAGNOSIS — M9904 Segmental and somatic dysfunction of sacral region: Secondary | ICD-10-CM | POA: Diagnosis not present

## 2018-03-30 DIAGNOSIS — M9903 Segmental and somatic dysfunction of lumbar region: Secondary | ICD-10-CM | POA: Diagnosis not present

## 2018-04-02 DIAGNOSIS — M9903 Segmental and somatic dysfunction of lumbar region: Secondary | ICD-10-CM | POA: Diagnosis not present

## 2018-04-02 DIAGNOSIS — M9904 Segmental and somatic dysfunction of sacral region: Secondary | ICD-10-CM | POA: Diagnosis not present

## 2018-04-02 DIAGNOSIS — M5386 Other specified dorsopathies, lumbar region: Secondary | ICD-10-CM | POA: Diagnosis not present

## 2018-04-05 DIAGNOSIS — M9904 Segmental and somatic dysfunction of sacral region: Secondary | ICD-10-CM | POA: Diagnosis not present

## 2018-04-05 DIAGNOSIS — M9903 Segmental and somatic dysfunction of lumbar region: Secondary | ICD-10-CM | POA: Diagnosis not present

## 2018-04-05 DIAGNOSIS — M5386 Other specified dorsopathies, lumbar region: Secondary | ICD-10-CM | POA: Diagnosis not present

## 2018-04-07 DIAGNOSIS — M9903 Segmental and somatic dysfunction of lumbar region: Secondary | ICD-10-CM | POA: Diagnosis not present

## 2018-04-07 DIAGNOSIS — M5386 Other specified dorsopathies, lumbar region: Secondary | ICD-10-CM | POA: Diagnosis not present

## 2018-04-07 DIAGNOSIS — M9904 Segmental and somatic dysfunction of sacral region: Secondary | ICD-10-CM | POA: Diagnosis not present

## 2018-04-09 DIAGNOSIS — M5386 Other specified dorsopathies, lumbar region: Secondary | ICD-10-CM | POA: Diagnosis not present

## 2018-04-09 DIAGNOSIS — M9904 Segmental and somatic dysfunction of sacral region: Secondary | ICD-10-CM | POA: Diagnosis not present

## 2018-04-09 DIAGNOSIS — M9903 Segmental and somatic dysfunction of lumbar region: Secondary | ICD-10-CM | POA: Diagnosis not present

## 2018-04-12 DIAGNOSIS — M5386 Other specified dorsopathies, lumbar region: Secondary | ICD-10-CM | POA: Diagnosis not present

## 2018-04-12 DIAGNOSIS — M9903 Segmental and somatic dysfunction of lumbar region: Secondary | ICD-10-CM | POA: Diagnosis not present

## 2018-04-12 DIAGNOSIS — M9904 Segmental and somatic dysfunction of sacral region: Secondary | ICD-10-CM | POA: Diagnosis not present

## 2018-04-14 DIAGNOSIS — M5386 Other specified dorsopathies, lumbar region: Secondary | ICD-10-CM | POA: Diagnosis not present

## 2018-04-14 DIAGNOSIS — M9904 Segmental and somatic dysfunction of sacral region: Secondary | ICD-10-CM | POA: Diagnosis not present

## 2018-04-14 DIAGNOSIS — M9903 Segmental and somatic dysfunction of lumbar region: Secondary | ICD-10-CM | POA: Diagnosis not present

## 2018-04-16 DIAGNOSIS — M9904 Segmental and somatic dysfunction of sacral region: Secondary | ICD-10-CM | POA: Diagnosis not present

## 2018-04-16 DIAGNOSIS — M9903 Segmental and somatic dysfunction of lumbar region: Secondary | ICD-10-CM | POA: Diagnosis not present

## 2018-04-16 DIAGNOSIS — M5386 Other specified dorsopathies, lumbar region: Secondary | ICD-10-CM | POA: Diagnosis not present

## 2018-04-20 DIAGNOSIS — M5386 Other specified dorsopathies, lumbar region: Secondary | ICD-10-CM | POA: Diagnosis not present

## 2018-04-20 DIAGNOSIS — M9904 Segmental and somatic dysfunction of sacral region: Secondary | ICD-10-CM | POA: Diagnosis not present

## 2018-04-20 DIAGNOSIS — M9903 Segmental and somatic dysfunction of lumbar region: Secondary | ICD-10-CM | POA: Diagnosis not present

## 2018-04-21 DIAGNOSIS — M545 Low back pain: Secondary | ICD-10-CM | POA: Diagnosis not present

## 2018-04-21 DIAGNOSIS — Z0000 Encounter for general adult medical examination without abnormal findings: Secondary | ICD-10-CM | POA: Diagnosis not present

## 2018-04-21 DIAGNOSIS — L0291 Cutaneous abscess, unspecified: Secondary | ICD-10-CM | POA: Insufficient documentation

## 2018-04-21 DIAGNOSIS — Z1211 Encounter for screening for malignant neoplasm of colon: Secondary | ICD-10-CM | POA: Diagnosis not present

## 2018-04-26 ENCOUNTER — Encounter: Payer: Self-pay | Admitting: Gastroenterology

## 2018-05-12 ENCOUNTER — Ambulatory Visit (AMBULATORY_SURGERY_CENTER): Payer: Self-pay

## 2018-05-12 ENCOUNTER — Encounter: Payer: Self-pay | Admitting: Gastroenterology

## 2018-05-12 VITALS — Ht 65.0 in | Wt 178.8 lb

## 2018-05-12 DIAGNOSIS — Z1211 Encounter for screening for malignant neoplasm of colon: Secondary | ICD-10-CM

## 2018-05-12 NOTE — Progress Notes (Signed)
No egg or soy allergy known to patient  No issues with past sedation with any surgeries  or procedures, no intubation problems  No diet pills per patient No home 02 use per patient  No blood thinners per patient  Pt denies issues with constipation  No A fib or A flutter  EMMI video sent to pt's e mail  Pt. declined 

## 2018-05-18 ENCOUNTER — Telehealth: Payer: Self-pay | Admitting: Gastroenterology

## 2018-05-18 NOTE — Telephone Encounter (Signed)
Pt returned your call and I gave him your message. He is aware that he needs to follow clear liquid diet and prep instructions strictly.

## 2018-05-18 NOTE — Telephone Encounter (Signed)
Had spanish-speaking employee call pt back and inform him he does not need to reschedule but to be sure and follow clear liquid diet and prep instructions carefully.

## 2018-05-19 ENCOUNTER — Encounter: Payer: Self-pay | Admitting: Gastroenterology

## 2018-05-19 ENCOUNTER — Ambulatory Visit (AMBULATORY_SURGERY_CENTER): Payer: Commercial Managed Care - PPO | Admitting: Gastroenterology

## 2018-05-19 VITALS — BP 112/70 | HR 55 | Temp 97.3°F | Resp 8 | Ht 65.0 in | Wt 178.0 lb

## 2018-05-19 DIAGNOSIS — D122 Benign neoplasm of ascending colon: Secondary | ICD-10-CM | POA: Diagnosis not present

## 2018-05-19 DIAGNOSIS — Z800 Family history of malignant neoplasm of digestive organs: Secondary | ICD-10-CM

## 2018-05-19 DIAGNOSIS — Z1211 Encounter for screening for malignant neoplasm of colon: Secondary | ICD-10-CM | POA: Diagnosis not present

## 2018-05-19 DIAGNOSIS — D128 Benign neoplasm of rectum: Secondary | ICD-10-CM

## 2018-05-19 DIAGNOSIS — E669 Obesity, unspecified: Secondary | ICD-10-CM | POA: Diagnosis not present

## 2018-05-19 DIAGNOSIS — K621 Rectal polyp: Secondary | ICD-10-CM | POA: Diagnosis not present

## 2018-05-19 DIAGNOSIS — D124 Benign neoplasm of descending colon: Secondary | ICD-10-CM | POA: Diagnosis not present

## 2018-05-19 MED ORDER — SODIUM CHLORIDE 0.9 % IV SOLN: 500.0000 mL | Freq: Once | INTRAVENOUS | Status: DC

## 2018-05-19 NOTE — Progress Notes (Signed)
Pt's states no medical or surgical changes since previsit or office visit. 

## 2018-05-19 NOTE — Patient Instructions (Signed)
YOU HAD AN ENDOSCOPIC PROCEDURE TODAY AT [facility]:   Refer to the procedure report that was given to you for any specific questions about what was found during the examination.  If the procedure report does not answer your questions, please call your gastroenterologist to clarify.  If you requested that your care partner not be given the details of your procedure findings, then the procedure report has been included in a sealed envelope for you to review at your convenience later.  YOU SHOULD EXPECT: Some feelings of bloating in the abdomen. Passage of more gas than usual.  Walking can help get rid of the air that was put into your GI tract during the procedure and reduce the bloating. If you had a lower endoscopy (such as a colonoscopy or flexible sigmoidoscopy) you may notice spotting of blood in your stool or on the toilet paper. If you underwent a bowel prep for your procedure, you may not have a normal bowel movement for a few days.  Please Note:  You might notice some irritation and congestion in your nose or some drainage.  This is from the oxygen used during your procedure.  There is no need for concern and it should clear up in a day or so.  SYMPTOMS TO REPORT IMMEDIATELY:   Following lower endoscopy (colonoscopy or flexible sigmoidoscopy):  Excessive amounts of blood in the stool  Significant tenderness or worsening of abdominal pains  Swelling of the abdomen that is new, acute  Fever of 100F or higher  Please see handouts given to you on Polyps.  For urgent or emergent issues, a gastroenterologist can be reached at any hour by calling [phone].   DIET:  We do recommend a small meal at first, but then you may proceed to your regular diet.  Drink plenty of fluids but you should avoid alcoholic beverages for 24 hours.  ACTIVITY:  You should plan to take it easy for the rest of today and you should NOT DRIVE or use heavy machinery until tomorrow (because of the  sedation medicines used during the test).    FOLLOW UP: Our staff will call the number listed on your records the next business day following your procedure to check on you and address any questions or concerns that you may have regarding the information given to you following your procedure. If we do not reach you, we will leave a message.  However, if you are feeling well and you are not experiencing any problems, there is no need to return our call.  We will assume that you have returned to your regular daily activities without incident.  If any biopsies were taken you will be contacted by phone or by letter within the next 1-3 weeks.  Please call us at [phone] if you have not heard about the biopsies in 3 weeks.    SIGNATURES/CONFIDENTIALITY: You and/or your care partner have signed paperwork which will be entered into your electronic medical record.  These signatures attest to the fact that that the information above on your After Visit Summary has been reviewed and is understood.  Full responsibility of the confidentiality of this discharge information lies with you and/or your care-partner.  Thank you for letting us take care of your heatlhcare needs today.

## 2018-05-19 NOTE — Progress Notes (Signed)
Called to room to assist during endoscopic procedure.  Patient ID and intended procedure confirmed with present staff. Received instructions for my participation in the procedure from the performing physician.  

## 2018-05-19 NOTE — Progress Notes (Signed)
To recovery, report to RN, VSS. 

## 2018-05-19 NOTE — Op Note (Signed)
[facility] Patient Name: Billy Rocha Procedure Date: 05/19/2018 1:47 PM MRN: [id number] Endoscopist: Thornton Park MD, MD Age: 57 Referring MD:  Date of Birth: 1960-10-07 Gender: Male Account #: [id number] Procedure:                Colonoscopy Indications:              Screening for colorectal malignant neoplasm. There                            is no known family history of colon cancer or                            polyps. No baseline GI symptoms. Medicines:                See the Anesthesia note for documentation of the                            administered medications Procedure:                Pre-Anesthesia Assessment:                           - Prior to the procedure, a History and Physical                            was performed, and patient medications and                            allergies were reviewed. The patient's tolerance of                            previous anesthesia was also reviewed. The risks                            and benefits of the procedure and the sedation                            options and risks were discussed with the patient.                            All questions were answered, and informed consent                            was obtained. Prior Anticoagulants: The patient has                            taken no previous anticoagulant or antiplatelet                            agents. ASA Grade Assessment: II - A patient with                            mild systemic disease. After reviewing the risks  and benefits, the patient was deemed in                            satisfactory condition to undergo the procedure.                           After obtaining informed consent, the colonoscope                            was passed under direct vision. Throughout the                            procedure, the patient's blood pressure, pulse, and                            oxygen saturations  were monitored continuously. The                            Colonoscope was introduced through the anus and                            advanced to the the terminal ileum, with                            identification of the appendiceal orifice and IC                            valve. The colonoscopy was performed without                            difficulty. The patient tolerated the procedure                            well. The quality of the bowel preparation was good. Scope In: 1:56:56 PM Scope Out: 2:15:06 PM Scope Withdrawal Time: 0 hours 15 minutes 46 seconds  Total Procedure Duration: 0 hours 18 minutes 10 seconds  Findings:                 Skin tags were found on perianal exam.                           A 4 mm polyp was found in the ascending colon. The                            polyp was sessile. The polyp was removed with a                            cold snare. Resection and retrieval were complete.                           A 3 mm polyp was found in the mid ascending colon.  The polyp was sessile. The polyp was removed with a                            cold snare. Resection and retrieval were complete.                           A 3 mm polyp was found in the ascending colon. The                            polyp was sessile. The polyp was removed with a                            cold snare. Resection and retrieval were complete.                           Two sessile polyps were found in the descending                            colon. The polyps were 1 to 2 mm in size. These                            polyps were removed with a cold biopsy forceps.                            Resection and retrieval were complete.                           A 3 mm polyp was found in the rectum. The polyp was                            sessile. The polyp was removed with a cold snare.                            Resection and retrieval were complete.                            The exam was otherwise without abnormality on                            direct and retroflexion views. Complications:            No immediate complications. Estimated Blood Loss:     Estimated blood loss: none. Impression:               - Perianal skin tags found on perianal exam.                           - One 4 mm polyp in the ascending colon, removed                            with a cold snare. Resected and retrieved.                           -  One 3 mm polyp in the mid ascending colon,                            removed with a cold snare. Resected and retrieved.                           - One 3 mm polyp in the ascending colon, removed                            with a cold snare. Resected and retrieved.                           - Two 1 to 2 mm polyps in the descending colon,                            removed with a cold biopsy forceps. Resected and                            retrieved.                           - One 3 mm polyp in the rectum, removed with a cold                            snare. Resected and retrieved.                           - The examination was otherwise normal on direct                            and retroflexion views. Recommendation:           - Discharge patient to home.                           - Resume previous diet today.                           - Continue present medications.                           - Await pathology results.                           - Repeat colonoscopy in 3 years for surveillance if                            at least 3 polyps are adenomatous. Repeat                            colonoscopy in 5 years for surveillance if only 1                            or 2 polyps are adenomatous. Thornton Park MD, MD 05/19/2018 2:22:12 PM This report  has been signed electronically.

## 2018-05-20 ENCOUNTER — Telehealth: Payer: Self-pay | Admitting: *Deleted

## 2018-05-20 ENCOUNTER — Telehealth: Payer: Self-pay

## 2018-05-20 NOTE — Telephone Encounter (Signed)
  Follow up Call-  Call back number 05/19/2018  Post procedure Call Back phone  # [phone]  Permission to leave phone message Yes  Some recent data might be hidden     Patient questions:  Do you have a fever, pain , or abdominal swelling? No. Pain Score  0 *  Have you tolerated food without any problems? Yes.    Have you been able to return to your normal activities? Yes.    Do you have any questions about your discharge instructions: Diet   No. Medications  No. Follow up visit  No.  Do you have questions or concerns about your Care? No.  Actions: * If pain score is 4 or above: No action needed, pain <4.

## 2018-05-20 NOTE — Telephone Encounter (Signed)
Attempted to reach pt. With follow-up call following endoscopy procedure 05/19/2018.  LM on pt. Voice mail to call if she has any questions or concerns.

## 2018-05-26 ENCOUNTER — Encounter: Payer: Self-pay | Admitting: Gastroenterology

## 2018-09-07 DIAGNOSIS — M7918 Myalgia, other site: Secondary | ICD-10-CM | POA: Diagnosis not present

## 2018-09-07 DIAGNOSIS — M791 Myalgia, unspecified site: Secondary | ICD-10-CM | POA: Insufficient documentation

## 2019-05-02 ENCOUNTER — Telehealth: Payer: Self-pay

## 2019-05-02 NOTE — Telephone Encounter (Signed)
Questions for Screening COVID-19  Symptom onset: N/a Travel or Contacts:No  During this illness, did/does the patient experience any of the following symptoms? Fever >100.36F []   Yes [x]   No []   Unknown Subjective fever (felt feverish) []   Yes [x]   No []   Unknown Chills []   Yes [x]   No []   Unknown Muscle aches (myalgia) []   Yes [x]   No []   Unknown Runny nose (rhinorrhea) []   Yes [x]   No []   Unknown Sore throat []   Yes [x]   No []   Unknown Cough (new onset or worsening of chronic cough) []   Yes [x]   No []   Unknown Shortness of breath (dyspnea) []   Yes [x]   No []   Unknown Nausea or vomiting []   Yes []   No []   Unknown Headache []   Yes [x]   No []   Unknown Abdominal pain  []   Yes [x]   No []   Unknown Diarrhea (?3 loose/looser than normal stools/24hr period) []   Yes [x]   No []   Unknown

## 2019-05-03 ENCOUNTER — Ambulatory Visit (INDEPENDENT_AMBULATORY_CARE_PROVIDER_SITE_OTHER): Payer: Commercial Managed Care - PPO | Admitting: Family Medicine

## 2019-05-03 ENCOUNTER — Encounter: Payer: Self-pay | Admitting: Family Medicine

## 2019-05-03 VITALS — BP 114/70 | HR 58 | Temp 97.5°F | Ht 65.0 in | Wt 183.6 lb

## 2019-05-03 DIAGNOSIS — Z13220 Encounter for screening for lipoid disorders: Secondary | ICD-10-CM | POA: Diagnosis not present

## 2019-05-03 DIAGNOSIS — Z125 Encounter for screening for malignant neoplasm of prostate: Secondary | ICD-10-CM

## 2019-05-03 DIAGNOSIS — Z0000 Encounter for general adult medical examination without abnormal findings: Secondary | ICD-10-CM

## 2019-05-03 DIAGNOSIS — M170 Bilateral primary osteoarthritis of knee: Secondary | ICD-10-CM | POA: Diagnosis not present

## 2019-05-03 DIAGNOSIS — Z114 Encounter for screening for human immunodeficiency virus [HIV]: Secondary | ICD-10-CM | POA: Insufficient documentation

## 2019-05-03 DIAGNOSIS — Z5181 Encounter for therapeutic drug level monitoring: Secondary | ICD-10-CM | POA: Insufficient documentation

## 2019-05-03 LAB — COMPREHENSIVE METABOLIC PANEL
ALT: 21 U/L (ref 0–53)
AST: 19 U/L (ref 0–37)
Albumin: 4.4 g/dL (ref 3.5–5.2)
Alkaline Phosphatase: 56 U/L (ref 39–117)
BUN: 14 mg/dL (ref 6–23)
CO2: 28 mEq/L (ref 19–32)
Calcium: 9 mg/dL (ref 8.4–10.5)
Chloride: 103 mEq/L (ref 96–112)
Creatinine, Ser: 0.92 mg/dL (ref 0.40–1.50)
GFR: 84.48 mL/min (ref >60.00)
Glucose, Bld: 93 mg/dL (ref 70–99)
Potassium: 4.2 mEq/L (ref 3.5–5.1)
Sodium: 137 mEq/L (ref 135–145)
Total Bilirubin: 0.7 mg/dL (ref 0.2–1.2)
Total Protein: 6.7 g/dL (ref 6.0–8.3)

## 2019-05-03 LAB — CBC WITH DIFFERENTIAL/PLATELET
Basophils Absolute: 0 10*3/uL (ref 0.0–0.1)
Basophils Relative: 0.9 % (ref 0.0–3.0)
Eosinophils Absolute: 0.1 10*3/uL (ref 0.0–0.7)
Eosinophils Relative: 2.7 % (ref 0.0–5.0)
HCT: 43.7 % (ref 39.0–52.0)
Hemoglobin: 14.5 g/dL (ref 13.0–17.0)
Lymphocytes Relative: 35.8 % (ref 12.0–46.0)
Lymphs Abs: 1.7 10*3/uL (ref 0.7–4.0)
MCHC: 33.2 g/dL (ref 30.0–36.0)
MCV: 88.1 fl (ref 78.0–100.0)
Monocytes Absolute: 0.5 10*3/uL (ref 0.1–1.0)
Monocytes Relative: 10.6 % (ref 3.0–12.0)
Neutro Abs: 2.3 10*3/uL (ref 1.4–7.7)
Neutrophils Relative %: 50 % (ref 43.0–77.0)
Platelets: 194 10*3/uL (ref 150.0–400.0)
RBC: 4.96 Mil/uL (ref 4.22–5.81)
RDW: 14.2 % (ref 11.5–15.5)
WBC: 4.7 10*3/uL (ref 4.0–10.5)

## 2019-05-03 LAB — LIPID PANEL
Cholesterol: 166 mg/dL (ref 0–200)
HDL: 51.3 mg/dL (ref >39.00)
LDL Cholesterol: 91 mg/dL (ref 0–99)
NonHDL: 114.41
Total CHOL/HDL Ratio: 3
Triglycerides: 119 mg/dL (ref 0.0–149.0)
VLDL: 23.8 mg/dL (ref 0.0–40.0)

## 2019-05-03 LAB — PSA: PSA: 0.75 ng/mL (ref 0.10–4.00)

## 2019-05-03 LAB — TSH: TSH: 1.63 u[IU]/mL (ref 0.35–4.50)

## 2019-05-03 MED ORDER — PENNSAID 2 % TD SOLN: 2.0000 | Freq: Two times a day (BID) | TRANSDERMAL | 1 refills | Status: DC

## 2019-05-03 NOTE — Progress Notes (Signed)
Billy Rocha - 58 y.o. male MRN [id number]  Date of birth: [birth year]  Subjective Chief Complaint  Patient presents with  . Establish Care    cpe X fasting.    HPI Billy Rocha is a 58 y.o. male with history of OA of the knees here today for initial visit and annual exam.  He reports that he is doing well at this time.   He was running daily until a few months ago however his knees have been bothering him so he switched to biking.  He cycles about 70 miles per week.  He follows a pretty healthy diet.  He is a non smoker and does not consume alcohol.  He sees a Pharmacist, community regularly.    Review of Systems  Constitutional: Negative for chills, fever, malaise/fatigue and weight loss.  HENT: Negative for congestion, ear pain and sore throat.   Eyes: Negative for blurred vision, double vision and pain.  Respiratory: Negative for cough and shortness of breath.   Cardiovascular: Negative for chest pain and palpitations.  Gastrointestinal: Negative for abdominal pain, blood in stool, constipation, heartburn and nausea.  Genitourinary: Negative for dysuria and urgency.  Musculoskeletal: Negative for joint pain and myalgias.  Neurological: Negative for dizziness and headaches.  Endo/Heme/Allergies: Does not bruise/bleed easily.  Psychiatric/Behavioral: Negative for depression. The patient is not nervous/anxious and does not have insomnia.      No Known Allergies  Past Medical History:  Diagnosis Date  . Arthritis     Past Surgical History:  Procedure Laterality Date  . CYST EXCISION Left 1993    Social History   Socioeconomic History  . Marital status: Married    Spouse name: Not on file  . Number of children: Not on file  . Years of education: Not on file  . Highest education level: Not on file  Occupational History  . Not on file  Social Needs  . Financial resource strain: Not on file  . Food insecurity    Worry: Not on file    Inability: Not on file  .  Transportation needs    Medical: Not on file    Non-medical: Not on file  Tobacco Use  . Smoking status: Never Smoker  . Smokeless tobacco: Never Used  Substance and Sexual Activity  . Alcohol use: No  . Drug use: No  . Sexual activity: Not on file  Lifestyle  . Physical activity    Days per week: Not on file    Minutes per session: Not on file  . Stress: Not on file  Relationships  . Social Herbalist on phone: Not on file    Gets together: Not on file    Attends religious service: Not on file    Active member of club or organization: Not on file    Attends meetings of clubs or organizations: Not on file    Relationship status: Not on file  Other Topics Concern  . Not on file  Social History Narrative  . Not on file    Family History  Problem Relation Age of Onset  . Cancer Mother   . Diabetes Mother   . Heart disease Mother   . Diabetes Sister   . Diabetes Brother   . Colon cancer Neg Hx   . Colon polyps Neg Hx   . Esophageal cancer Neg Hx   . Rectal cancer Neg Hx   . Stomach cancer Neg Hx     Health Maintenance  Topic Date  Due  . HIV Screening  04/04/1976  . COLONOSCOPY  05/19/2021  . TETANUS/TDAP  03/31/2024  . INFLUENZA VACCINE  Completed  . Hepatitis C Screening  Completed    ----------------------------------------------------------------------------------------------------------------------------------------------------------------------------------------------------------------- Physical Exam BP 114/70   Pulse (!) 58   Temp (!) 97.5 F (36.4 C) (Oral)   Ht _0  (1.651 m)   Wt 183 lb 9.6 oz (83.3 kg)   SpO2 98%   BMI 30.55 kg/m   Physical Exam Constitutional:      General: He is not in acute distress. HENT:     Head: Normocephalic and atraumatic.     Right Ear: Tympanic membrane and ear canal normal.     Left Ear: Tympanic membrane and ear canal normal.     Mouth/Throat:     Mouth: Mucous membranes are moist.  Eyes:      General: No scleral icterus. Neck:     Musculoskeletal: Normal range of motion.     Thyroid: No thyromegaly.  Cardiovascular:     Rate and Rhythm: Normal rate and regular rhythm.     Heart sounds: Normal heart sounds.  Pulmonary:     Effort: Pulmonary effort is normal.     Breath sounds: Normal breath sounds.  Abdominal:     General: Bowel sounds are normal. There is no distension.     Palpations: Abdomen is soft.     Tenderness: There is no abdominal tenderness. There is no guarding.  Lymphadenopathy:     Cervical: No cervical adenopathy.  Skin:    General: Skin is warm and dry.     Findings: No rash.  Neurological:     General: No focal deficit present.     Mental Status: He is alert and oriented to person, place, and time.     Cranial Nerves: No cranial nerve deficit.     Motor: No abnormal muscle tone.  Psychiatric:        Mood and Affect: Mood normal.        Behavior: Behavior normal.     ------------------------------------------------------------------------------------------------------------------------------------------------------------------------------------------------------------------- Assessment and Plan  Arthritis of both knees -Patient really enjoys running and would like to get back to doing this some, will provide trial of pennsaid  Well adult exam Well adult Orders Placed This Encounter  Procedures  . Comp Met (CMET)  . CBC w/Diff  . Lipid panel  . TSH  . PSA  Screenings:  PSA, Lipid Immunizations: UTD Anticipatory guidance/Risk factor reduction:  Continue healthy habits with regular exercise and healthy diet.  Additional recommendations per AVS.

## 2019-05-03 NOTE — Assessment & Plan Note (Signed)
Well adult Orders Placed This Encounter  Procedures  . Comp Met (CMET)  . CBC w/Diff  . Lipid panel  . TSH  . PSA  Screenings:  PSA, Lipid Immunizations: UTD Anticipatory guidance/Risk factor reduction:  Continue healthy habits with regular exercise and healthy diet.  Additional recommendations per AVS.

## 2019-05-03 NOTE — Assessment & Plan Note (Signed)
-  Patient really enjoys running and would like to get back to doing this some, will provide trial of pennsaid

## 2019-05-03 NOTE — Patient Instructions (Signed)
Preventive Care 40-58 Years Old, Male Preventive care refers to lifestyle choices and visits with your health care provider that can promote health and wellness. This includes:  A yearly physical exam. This is also called an annual well check.  Regular dental and eye exams.  Immunizations.  Screening for certain conditions.  Healthy lifestyle choices, such as eating a healthy diet, getting regular exercise, not using drugs or products that contain nicotine and tobacco, and limiting alcohol use. What can I expect for my preventive care visit? Physical exam Your health care provider will check:  Height and weight. These may be used to calculate body mass index (BMI), which is a measurement that tells if you are at a healthy weight.  Heart rate and blood pressure.  Your skin for abnormal spots. Counseling Your health care provider may ask you questions about:  Alcohol, tobacco, and drug use.  Emotional well-being.  Home and relationship well-being.  Sexual activity.  Eating habits.  Work and work environment. What immunizations do I need?  Influenza (flu) vaccine  This is recommended every year. Tetanus, diphtheria, and pertussis (Tdap) vaccine  You may need a Td booster every 10 years. Varicella (chickenpox) vaccine  You may need this vaccine if you have not already been vaccinated. Zoster (shingles) vaccine  You may need this after age 60. Measles, mumps, and rubella (MMR) vaccine  You may need at least one dose of MMR if you were born in 1957 or later. You may also need a second dose. Pneumococcal conjugate (PCV13) vaccine  You may need this if you have certain conditions and were not previously vaccinated. Pneumococcal polysaccharide (PPSV23) vaccine  You may need one or two doses if you smoke cigarettes or if you have certain conditions. Meningococcal conjugate (MenACWY) vaccine  You may need this if you have certain conditions. Hepatitis A vaccine   You may need this if you have certain conditions or if you travel or work in places where you may be exposed to hepatitis A. Hepatitis B vaccine  You may need this if you have certain conditions or if you travel or work in places where you may be exposed to hepatitis B. Haemophilus influenzae type b (Hib) vaccine  You may need this if you have certain risk factors. Human papillomavirus (HPV) vaccine  If recommended by your health care provider, you may need three doses over 6 months. You may receive vaccines as individual doses or as more than one vaccine together in one shot (combination vaccines). Talk with your health care provider about the risks and benefits of combination vaccines. What tests do I need? Blood tests  Lipid and cholesterol levels. These may be checked every 5 years, or more frequently if you are over 50 years old.  Hepatitis C test.  Hepatitis B test. Screening  Lung cancer screening. You may have this screening every year starting at age 55 if you have a 30-pack-year history of smoking and currently smoke or have quit within the past 15 years.  Prostate cancer screening. Recommendations will vary depending on your family history and other risks.  Colorectal cancer screening. All adults should have this screening starting at age 50 and continuing until age 75. Your health care provider may recommend screening at age 45 if you are at increased risk. You will have tests every 1-10 years, depending on your results and the type of screening test.  Diabetes screening. This is done by checking your blood sugar (glucose) after you have not eaten   for a while (fasting). You may have this done every 1-3 years.  Sexually transmitted disease (STD) testing. Follow these instructions at home: Eating and drinking  Eat a diet that includes fresh fruits and vegetables, whole grains, lean protein, and low-fat dairy products.  Take vitamin and mineral supplements as recommended  by your health care provider.  Do not drink alcohol if your health care provider tells you not to drink.  If you drink alcohol: ? Limit how much you have to 0-2 drinks a day. ? Be aware of how much alcohol is in your drink. In the [place] one drink equals one 12 oz bottle of beer (355 mL), one 5 oz glass of wine (148 mL), or one 1 oz glass of hard liquor (44 mL). Lifestyle  Take daily care of your teeth and gums.  Stay active. Exercise for at least 30 minutes on 5 or more days each week.  Do not use any products that contain nicotine or tobacco, such as cigarettes, e-cigarettes, and chewing tobacco. If you need help quitting, ask your health care provider.  If you are sexually active, practice safe sex. Use a condom or other form of protection to prevent STIs (sexually transmitted infections).  Talk with your health care provider about taking a low-dose aspirin every day starting at age 47. What's next?  Go to your health care provider once a year for a well check visit.  Ask your health care provider how often you should have your eyes and teeth checked.  Stay up to date on all vaccines. This information is not intended to replace advice given to you by your health care provider. Make sure you discuss any questions you have with your health care provider. Document Released: 07/27/2015 Document Revised: 06/24/2018 Document Reviewed: 06/24/2018 Elsevier Patient Education  2020 [employer].

## 2019-05-12 NOTE — Progress Notes (Signed)
Please inform patient of normal labs.  Thanks!

## 2019-06-06 DIAGNOSIS — M25561 Pain in right knee: Secondary | ICD-10-CM | POA: Insufficient documentation

## 2019-06-07 DIAGNOSIS — M25529 Pain in unspecified elbow: Secondary | ICD-10-CM | POA: Insufficient documentation

## 2019-06-07 DIAGNOSIS — M545 Low back pain, unspecified: Secondary | ICD-10-CM | POA: Insufficient documentation

## 2019-06-13 DIAGNOSIS — G8929 Other chronic pain: Secondary | ICD-10-CM | POA: Insufficient documentation

## 2019-06-13 DIAGNOSIS — M25562 Pain in left knee: Secondary | ICD-10-CM | POA: Insufficient documentation

## 2021-02-21 ENCOUNTER — Encounter: Payer: Self-pay | Admitting: Family Medicine

## 2021-02-21 ENCOUNTER — Ambulatory Visit (INDEPENDENT_AMBULATORY_CARE_PROVIDER_SITE_OTHER): Payer: Commercial Managed Care - PPO | Admitting: Family Medicine

## 2021-02-21 ENCOUNTER — Other Ambulatory Visit: Payer: Self-pay

## 2021-02-21 VITALS — BP 114/68 | HR 64 | Temp 97.0°F | Ht 65.0 in | Wt 174.4 lb

## 2021-02-21 DIAGNOSIS — Z0000 Encounter for general adult medical examination without abnormal findings: Secondary | ICD-10-CM

## 2021-02-21 DIAGNOSIS — M25561 Pain in right knee: Secondary | ICD-10-CM | POA: Diagnosis not present

## 2021-02-21 DIAGNOSIS — R350 Frequency of micturition: Secondary | ICD-10-CM | POA: Insufficient documentation

## 2021-02-21 DIAGNOSIS — M170 Bilateral primary osteoarthritis of knee: Secondary | ICD-10-CM | POA: Diagnosis not present

## 2021-02-21 DIAGNOSIS — N401 Enlarged prostate with lower urinary tract symptoms: Secondary | ICD-10-CM | POA: Diagnosis not present

## 2021-02-21 DIAGNOSIS — M25562 Pain in left knee: Secondary | ICD-10-CM

## 2021-02-21 DIAGNOSIS — R0683 Snoring: Secondary | ICD-10-CM

## 2021-02-21 LAB — COMPREHENSIVE METABOLIC PANEL
ALT: 20 U/L (ref 0–53)
AST: 17 U/L (ref 0–37)
Albumin: 4.2 g/dL (ref 3.5–5.2)
Alkaline Phosphatase: 60 U/L (ref 39–117)
BUN: 20 mg/dL (ref 6–23)
CO2: 28 mEq/L (ref 19–32)
Calcium: 9 mg/dL (ref 8.4–10.5)
Chloride: 104 mEq/L (ref 96–112)
Creatinine, Ser: 0.93 mg/dL (ref 0.40–1.50)
GFR: 89.64 mL/min (ref >60.00)
Glucose, Bld: 88 mg/dL (ref 70–99)
Potassium: 4.4 mEq/L (ref 3.5–5.1)
Sodium: 138 mEq/L (ref 135–145)
Total Bilirubin: 0.6 mg/dL (ref 0.2–1.2)
Total Protein: 6.6 g/dL (ref 6.0–8.3)

## 2021-02-21 LAB — URINALYSIS, ROUTINE W REFLEX MICROSCOPIC
Bilirubin Urine: NEGATIVE
Hgb urine dipstick: NEGATIVE
Ketones, ur: NEGATIVE
Leukocytes,Ua: NEGATIVE
Nitrite: NEGATIVE
RBC / HPF: NONE SEEN (ref 0–?)
Specific Gravity, Urine: 1.02 (ref 1.000–1.030)
Total Protein, Urine: NEGATIVE
Urine Glucose: NEGATIVE
Urobilinogen, UA: 0.2 (ref 0.0–1.0)
WBC, UA: NONE SEEN (ref 0–?)
pH: 6 (ref 5.0–8.0)

## 2021-02-21 LAB — LIPID PANEL
Cholesterol: 171 mg/dL (ref 0–200)
HDL: 58.9 mg/dL (ref >39.00)
LDL Cholesterol: 96 mg/dL (ref 0–99)
NonHDL: 111.87
Total CHOL/HDL Ratio: 3
Triglycerides: 77 mg/dL (ref 0.0–149.0)
VLDL: 15.4 mg/dL (ref 0.0–40.0)

## 2021-02-21 LAB — CBC
HCT: 42.3 % (ref 39.0–52.0)
Hemoglobin: 14.1 g/dL (ref 13.0–17.0)
MCHC: 33.4 g/dL (ref 30.0–36.0)
MCV: 87.1 fl (ref 78.0–100.0)
Platelets: 208 10*3/uL (ref 150.0–400.0)
RBC: 4.86 Mil/uL (ref 4.22–5.81)
RDW: 14.1 % (ref 11.5–15.5)
WBC: 5.1 10*3/uL (ref 4.0–10.5)

## 2021-02-21 LAB — PSA: PSA: 1.04 ng/mL (ref 0.10–4.00)

## 2021-02-21 MED ORDER — TAMSULOSIN HCL 0.4 MG PO CAPS: 0.4000 mg | ORAL_CAPSULE | Freq: Every day | ORAL | 3 refills | Status: DC

## 2021-02-21 NOTE — Progress Notes (Signed)
New Patient Office Visit  Subjective:  Patient ID: Billy Rocha, male    DOB: [birth year]  Age: 60 y.o. MRN: [id number]  CC:  Chief Complaint  Patient presents with   Transitions Of Care    TOC from Dr. Zigmund Daniel, no concerns. Patient would like a referral for sleep study and to urologist. Fasting for labs.      HPI Billy Rocha presents for complete physical exam, he is fasting.  Reports difficulty with urination with frequency decreased force of stream and urgency.  Also complains of bilateral knee pain.  He has run in the past.  He is now riding his bicycle.  He is seeing orthopedics and they have been advised that he has some arthritis in his knees.  He has been using Voltaren gel.  Patient snores.  He keeps his wife awake.  His wife has witnessed apneic episodes.  He tells of nonrestorative sleep and decreased energy.  Recent TSH measured at 1.63.  Past Medical History:  Diagnosis Date   Arthritis     Past Surgical History:  Procedure Laterality Date   CYST EXCISION Left 1993    Family History  Problem Relation Age of Onset   Cancer Mother    Diabetes Mother    Heart disease Mother    Diabetes Sister    Diabetes Brother    Colon cancer Neg Hx    Colon polyps Neg Hx    Esophageal cancer Neg Hx    Rectal cancer Neg Hx    Stomach cancer Neg Hx     Social History   Socioeconomic History   Marital status: Married    Spouse name: Not on file   Number of children: Not on file   Years of education: Not on file   Highest education level: Not on file  Occupational History   Not on file  Tobacco Use   Smoking status: Never   Smokeless tobacco: Never  Vaping Use   Vaping Use: Never used  Substance and Sexual Activity   Alcohol use: Yes    Alcohol/week: 1.0 standard drink    Types: 1 Cans of beer per week    Comment: rare   Drug use: No   Sexual activity: Yes  Other Topics Concern   Not on file  Social History Narrative   Not on file    Social Determinants of Health   Financial Resource Strain: Not on file  Food Insecurity: Not on file  Transportation Needs: Not on file  Physical Activity: Not on file  Stress: Not on file  Social Connections: Not on file  Intimate Partner Violence: Not on file    ROS Review of Systems  Constitutional:  Negative for diaphoresis, fatigue, fever and unexpected weight change.  HENT: Negative.    Eyes:  Negative for photophobia and visual disturbance.  Respiratory:  Positive for apnea. Negative for shortness of breath and wheezing.   Cardiovascular: Negative.   Gastrointestinal:  Negative for abdominal pain, constipation and diarrhea.  Endocrine: Negative for polyphagia and polyuria.  Genitourinary:  Positive for difficulty urinating, frequency and urgency.  Musculoskeletal:  Positive for arthralgias.  Skin: Negative.   Neurological:  Negative for speech difficulty and weakness.  Psychiatric/Behavioral: Negative.     Objective:   Today's Vitals: BP 114/68 (BP Location: Left Arm, Patient Position: Sitting, Cuff Size: Normal)   Pulse 64   Temp (!) 97 F (36.1 C) (Temporal)   Ht '5\' 5"'$  (1.651 m)   Wt 174 lb 6.4  oz (79.1 kg)   SpO2 97%   BMI 29.02 kg/m   Physical Exam Vitals and nursing note reviewed.  Constitutional:      General: He is not in acute distress.    Appearance: Normal appearance. He is normal weight. He is not ill-appearing, toxic-appearing or diaphoretic.  HENT:     Head: Normocephalic and atraumatic.     Right Ear: Tympanic membrane, ear canal and external ear normal.     Left Ear: Tympanic membrane, ear canal and external ear normal.     Mouth/Throat:     Mouth: Mucous membranes are moist.     Pharynx: Oropharynx is clear. No oropharyngeal exudate or posterior oropharyngeal erythema.  Eyes:     General: No scleral icterus.       Right eye: No discharge.        Left eye: No discharge.     Extraocular Movements: Extraocular movements intact.      Conjunctiva/sclera: Conjunctivae normal.     Pupils: Pupils are equal, round, and reactive to light.  Neck:     Vascular: No carotid bruit.  Cardiovascular:     Rate and Rhythm: Normal rate and regular rhythm.  Pulmonary:     Effort: Pulmonary effort is normal.     Breath sounds: Normal breath sounds.  Abdominal:     General: Abdomen is flat. Bowel sounds are normal. There is no distension.     Palpations: Abdomen is soft. There is no mass.     Tenderness: There is no abdominal tenderness. There is no guarding or rebound.     Hernia: No hernia is present. There is no hernia in the left inguinal area or right inguinal area.  Genitourinary:    Penis: Uncircumcised. No phimosis, paraphimosis, hypospadias, erythema, tenderness or discharge.      Testes:        Right: Mass, tenderness or swelling not present. Right testis is descended.        Left: Mass, tenderness or swelling not present. Left testis is descended.     Epididymis:     Right: Not inflamed or enlarged.     Left: Not inflamed or enlarged.     Prostate: Enlarged. Not tender and no nodules present.     Rectum: Guaiac result negative. No mass, tenderness, anal fissure, external hemorrhoid or internal hemorrhoid. Normal anal tone.  Musculoskeletal:     Cervical back: Normal range of motion and neck supple. No rigidity or tenderness.  Lymphadenopathy:     Cervical: No cervical adenopathy.     Lower Body: No right inguinal adenopathy. No left inguinal adenopathy.  Skin:    General: Skin is warm and dry.  Neurological:     Mental Status: He is alert and oriented to person, place, and time.    Assessment & Plan:   Problem List Items Addressed This Visit       Musculoskeletal and Integument   Arthritis of both knees - Primary     Other   Well adult exam   Relevant Orders   CBC   Comprehensive metabolic panel   Lipid panel   PSA   Urinalysis, Routine w reflex microscopic   Benign prostatic hyperplasia with urinary  frequency   Relevant Medications   tamsulosin (FLOMAX) 0.4 MG CAPS capsule   Other Relevant Orders   PSA   Urinalysis, Routine w reflex microscopic   Snores   Relevant Orders   Ambulatory referral to Sleep Studies    Outpatient Encounter Medications  as of 02/21/2021  Medication Sig   tamsulosin (FLOMAX) 0.4 MG CAPS capsule Take 1 capsule (0.4 mg total) by mouth daily.   [DISCONTINUED] Diclofenac Sodium (PENNSAID) 2 % SOLN Place 2 Pump onto the skin 2 (two) times daily.   [DISCONTINUED] glucosamine-chondroitin 500-400 MG tablet Take 1 tablet by mouth 3 (three) times daily.   No facility-administered encounter medications on file as of 02/21/2021.    Follow-up: Return in about 3 months (around 05/24/2021).   Given information on health maintenance and disease prevention.  Advised to go ahead and have his second Shingrix vaccine.  Also given information on sleep apnea.  Agrees to go for sleep study.  Given information on BPH and tamsulosin.  All information was given in [place].  Patient will follow-up with orthopedic as needed for his knee pain.  Advised him to stop running and just riding his bicycle.  Use Voltaren gel as needed.  Libby Maw, MD

## 2021-05-16 ENCOUNTER — Other Ambulatory Visit: Payer: Self-pay

## 2021-05-16 ENCOUNTER — Ambulatory Visit: Payer: Commercial Managed Care - PPO | Admitting: Neurology

## 2021-05-16 ENCOUNTER — Encounter: Payer: Self-pay | Admitting: Neurology

## 2021-05-16 VITALS — BP 116/67 | HR 63 | Ht 65.0 in | Wt 177.2 lb

## 2021-05-16 DIAGNOSIS — R0681 Apnea, not elsewhere classified: Secondary | ICD-10-CM | POA: Diagnosis not present

## 2021-05-16 DIAGNOSIS — Z820 Family history of epilepsy and other diseases of the nervous system: Secondary | ICD-10-CM

## 2021-05-16 DIAGNOSIS — R519 Headache, unspecified: Secondary | ICD-10-CM

## 2021-05-16 DIAGNOSIS — G4719 Other hypersomnia: Secondary | ICD-10-CM | POA: Diagnosis not present

## 2021-05-16 DIAGNOSIS — R0683 Snoring: Secondary | ICD-10-CM | POA: Diagnosis not present

## 2021-05-16 DIAGNOSIS — E663 Overweight: Secondary | ICD-10-CM | POA: Diagnosis not present

## 2021-05-16 DIAGNOSIS — R351 Nocturia: Secondary | ICD-10-CM

## 2021-05-16 NOTE — Patient Instructions (Signed)

## 2021-05-16 NOTE — Progress Notes (Signed)
Subjective:    Patient ID: Billy Rocha is a 60 y.o. male.    Star Age, MD, PhD [facility] [address], [address] [place],  [address]  Dear Dr. Ethelene Hal,   I saw your patient, Billy Rocha, upon your kind request in my neurologic clinic today for initial consultation of his sleep disorder, in particular, concern for underlying obstructive sleep apnea. The patient is unaccompanied today. As you know, Mr. Dicenzo is a 60 year old right-handed gentleman with an underlying medical history of arthritis, BPH and overweight state, who reports snoring, witnessed apneas per wife, and daytime somnolence. I reviewed your office note from 02/21/2021.  His Epworth sleepiness score is 13 out of 24, fatigue severity score is 39 out of 63.  He lives with his wife and his in-laws.  He has 3 grown children.  He works as a Programme researcher, broadcasting/film/video.  He has been stable with his weight.  He has had some morning headaches which are typically in the back of his head.  He has had recent stress, had to take his mother-in-law to the emergency room a few days ago.  He is a non-smoker and drinks alcohol very occasionally, caffeine in the form of 1 cup of tea or coffee per day on average.  His brother and sister has a CPAP machine.  They have mobility within the household.  He does not wake up fully rested.  He has nocturia about 2-3 times per average night.  Bedtime is generally around 9 and rise time around 4:30 AM.  He typically has to be at work around 5 AM.  Snoring is loud and typically quite disturbing to his wife.  His Past Medical History Is Significant For: Past Medical History:  Diagnosis Date   Arthritis     His Past Surgical History Is Significant For: Past Surgical History:  Procedure Laterality Date   CYST EXCISION Left 1993    His Family History Is Significant For: Family History  Problem Relation Age of Onset   Cancer Mother    Diabetes Mother     Heart disease Mother    Diabetes Sister    Diabetes Brother    Colon cancer Neg Hx    Colon polyps Neg Hx    Esophageal cancer Neg Hx    Rectal cancer Neg Hx    Stomach cancer Neg Hx     His Social History Is Significant For: Social History   Socioeconomic History   Marital status: Married    Spouse name: Not on file   Number of children: Not on file   Years of education: Not on file   Highest education level: Not on file  Occupational History   Not on file  Tobacco Use   Smoking status: Never   Smokeless tobacco: Never  Vaping Use   Vaping Use: Never used  Substance and Sexual Activity   Alcohol use: Yes    Alcohol/week: 1.0 standard drink    Types: 1 Cans of beer per week    Comment: rare   Drug use: No   Sexual activity: Yes  Other Topics Concern   Not on file  Social History Narrative   Not on file   Social Determinants of Health   Financial Resource Strain: Not on file  Food Insecurity: Not on file  Transportation Needs: Not on file  Physical Activity: Not on file  Stress: Not on file  Social Connections: Not on file    His Allergies Are:  No Known Allergies:   His Current Medications Are:  Outpatient Encounter Medications as of 05/16/2021  Medication Sig   tamsulosin (FLOMAX) 0.4 MG CAPS capsule Take 1 capsule (0.4 mg total) by mouth daily.   No facility-administered encounter medications on file as of 05/16/2021.  :   Review of Systems:  Out of a complete 14 point review of systems, all are reviewed and negative with the exception of these symptoms as listed below:   Review of Systems  Neurological:        Snoring. Fatigue.  No apnea.  Was here previously could not remember why.  ESS 13 FSS 39   Objective:  Neurological Exam  Physical Exam Physical Examination:   Vitals:   05/16/21 1122  BP: 116/67  Pulse: 63    General Examination: The patient is a very pleasant 60 y.o. male in no acute distress. He appears well-developed and  well-nourished and well groomed.   HEENT: Normocephalic, atraumatic, pupils are equal, round and reactive to light, extraocular tracking is good without limitation to gaze excursion or nystagmus noted. Hearing is grossly intact. Face is symmetric with normal facial animation. Speech is clear with no dysarthria noted. There is no hypophonia. There is no lip, neck/head, jaw or voice tremor. Neck is supple with full range of passive and active motion. There are no carotid bruits on auscultation. Oropharynx exam reveals: No significant mouth dryness, good dental hygiene, moderate airway crowding secondary to larger uvula, tonsils of about 1+ bilaterally.  Mallampati class III.  Neck circumference of 16 inches.  Tongue protrudes centrally and palate elevates symmetrically.  Minimal overbite.   Chest: Clear to auscultation without wheezing, rhonchi or crackles noted.  Heart: S1+S2+0, regular and normal without murmurs, rubs or gallops noted.   Abdomen: Soft, non-tender and non-distended with normal bowel sounds appreciated on auscultation.  Extremities: There is no pitting edema in the distal lower extremities bilaterally.   Skin: Warm and dry without trophic changes noted.   Musculoskeletal: exam reveals no obvious joint deformities, tenderness or joint swelling or erythema.   Neurologically:  Mental status: The patient is awake, alert and oriented in all 4 spheres. His immediate and remote memory, attention, language skills and fund of knowledge are appropriate. There is no evidence of aphasia, agnosia, apraxia or anomia. Speech is clear with normal prosody and enunciation. Thought process is linear. Mood is normal and affect is normal.  Cranial nerves II - XII are as described above under HEENT exam.  Motor exam: Normal bulk, strength and tone is noted. There is no tremor, Romberg is negative. Fine motor skills and coordination: grossly intact.  Cerebellar testing: No dysmetria or intention tremor.  There is no truncal or gait ataxia.  Sensory exam: intact to light touch in the upper and lower extremities.  Gait, station and balance: He stands easily. No veering to one side is noted. No leaning to one side is noted. Posture is age-appropriate and stance is narrow based. Gait shows normal stride length and normal pace. No problems turning are noted. Tandem walk is unremarkable.                Assessment and Plan:  In summary, Jaquan Rocha is a very pleasant 60 y.o.-year old male with an underlying medical history of arthritis, BPH and overweight state, whose history and physical exam are concerning for obstructive sleep apnea (OSA). I had a long chat with the patient about my findings and the diagnosis of OSA, its prognosis and  treatment options. We talked about medical treatments, surgical interventions and non-pharmacological approaches. I explained in particular the risks and ramifications of untreated moderate to severe OSA, especially with respect to developing cardiovascular disease down the Road, including congestive heart failure, difficult to treat hypertension, cardiac arrhythmias, or stroke. Even type 2 diabetes has, in part, been linked to untreated OSA. Symptoms of untreated OSA include daytime sleepiness, memory problems, mood irritability and mood disorder such as depression and anxiety, lack of energy, as well as recurrent headaches, especially morning headaches. We talked about trying to maintain a healthy lifestyle in general, as well as the importance of weight control. We also talked about the importance of good sleep hygiene. I recommended the following at this time: sleep study.  I outlined the difference between a laboratory attended sleep study versus home sleep test.  I explained the sleep test procedure to the patient and also outlined possible surgical and non-surgical treatment options of OSA, including the use of a custom-made dental device (which would require a  referral to a specialist dentist or oral surgeon), upper airway surgical options, such as traditional UPPP or a novel less invasive surgical option in the form of Inspire hypoglossal nerve stimulation (which would involve a referral to an ENT surgeon). I also explained the CPAP treatment option to the patient, who indicated that he would be willing to try CPAP or AutoPap if the need arises. I explained the importance of being compliant with PAP treatment, not only for insurance purposes but primarily to improve His symptoms, and for the patient's long term health benefit, including to reduce His cardiovascular risks. I answered all his questions today and the patient was in agreement. I plan to see him back after the sleep study is completed and encouraged him to call with any interim questions, concerns, problems or updates.   Thank you very much for allowing me to participate in the care of this nice patient. If I can be of any further assistance to you please do not hesitate to call me at [phone].  Sincerely,   Star Age, MD, PhD

## 2021-05-29 ENCOUNTER — Ambulatory Visit: Payer: Commercial Managed Care - PPO | Admitting: Family Medicine

## 2021-07-03 ENCOUNTER — Other Ambulatory Visit: Payer: Self-pay | Admitting: Family Medicine

## 2021-07-03 ENCOUNTER — Telehealth: Payer: Self-pay | Admitting: Family Medicine

## 2021-07-03 DIAGNOSIS — N401 Enlarged prostate with lower urinary tract symptoms: Secondary | ICD-10-CM

## 2021-07-03 NOTE — Telephone Encounter (Signed)
°  Encourage patient to contact the pharmacy for refills or they can request refills through [place]:  Please schedule appointment if longer than 1 year  NEXT APPOINTMENT DATE: 07/18/21  MEDICATION: tamsulosin   Is the patient out of medication? Has 2 pills left  PHARMACY:[facility]- [address]  Let patient know to contact pharmacy at the end of the day to make sure medication is ready.  Please notify patient to allow 48-72 hours to process  CLINICAL FILLS OUT ALL BELOW:   LAST REFILL:  QTY:  REFILL DATE:    OTHER COMMENTS:    Okay for refill?  Please advise

## 2021-07-18 ENCOUNTER — Encounter: Payer: Self-pay | Admitting: Family Medicine

## 2021-07-18 ENCOUNTER — Ambulatory Visit: Payer: Commercial Managed Care - PPO | Admitting: Family Medicine

## 2021-07-18 ENCOUNTER — Other Ambulatory Visit: Payer: Self-pay

## 2021-07-18 VITALS — BP 124/70 | HR 67 | Temp 97.5°F | Ht 65.0 in | Wt 181.4 lb

## 2021-07-18 DIAGNOSIS — S161XXA Strain of muscle, fascia and tendon at neck level, initial encounter: Secondary | ICD-10-CM | POA: Diagnosis not present

## 2021-07-18 DIAGNOSIS — R0981 Nasal congestion: Secondary | ICD-10-CM

## 2021-07-18 MED ORDER — CYCLOBENZAPRINE HCL 10 MG PO TABS: 10.0000 mg | ORAL_TABLET | Freq: Three times a day (TID) | ORAL | 0 refills | Status: DC | PRN

## 2021-07-18 MED ORDER — CYCLOBENZAPRINE HCL 10 MG PO TABS: 10.0000 mg | ORAL_TABLET | Freq: Every day | ORAL | 0 refills | Status: DC

## 2021-07-18 MED ORDER — MOMETASONE FUROATE 50 MCG/ACT NA SUSP: 2.0000 | Freq: Every day | NASAL | 12 refills | Status: DC

## 2021-07-18 MED ORDER — PREDNISONE 10 MG (21) PO TBPK: ORAL_TABLET | ORAL | 0 refills | Status: DC

## 2021-07-18 NOTE — Progress Notes (Signed)
Established Patient Office Visit  Subjective:  Patient ID: Billy Rocha, male    DOB: June 05, 1961  Age: 61 y.o. MRN: [id number]  CC:  Chief Complaint  Patient presents with   Follow-up    3 month follow up, states that nose is always stuffy, stiff neck at times. Possible sleep study.      HPI Billy Rocha presents for evaluation of a 25-month history of nasal congestion.  Symptoms started after the flooring in his house was changed.  Brief episodes of a.m. clear rhinorrhea.  Denies pressure or pain.  He has been using a nasal decongestant intermittently but no more than once a week he tells me.  No fevers or chills.  Has ongoing intermittent stiffness in his neck in the posterior region.  No injury.  Tends to bother him at night.  Past Medical History:  Diagnosis Date   Arthritis     Past Surgical History:  Procedure Laterality Date   CYST EXCISION Left 1993    Family History  Problem Relation Age of Onset   Cancer Mother    Diabetes Mother    Heart disease Mother    Diabetes Sister    Diabetes Brother    Colon cancer Neg Hx    Colon polyps Neg Hx    Esophageal cancer Neg Hx    Rectal cancer Neg Hx    Stomach cancer Neg Hx     Social History   Socioeconomic History   Marital status: Married    Spouse name: Not on file   Number of children: Not on file   Years of education: Not on file   Highest education level: Not on file  Occupational History   Not on file  Tobacco Use   Smoking status: Never   Smokeless tobacco: Never  Vaping Use   Vaping Use: Never used  Substance and Sexual Activity   Alcohol use: Yes    Alcohol/week: 1.0 standard drink    Types: 1 Cans of beer per week    Comment: rare   Drug use: No   Sexual activity: Yes  Other Topics Concern   Not on file  Social History Narrative   Not on file   Social Determinants of Health   Financial Resource Strain: Not on file  Food Insecurity: Not on file  Transportation  Needs: Not on file  Physical Activity: Not on file  Stress: Not on file  Social Connections: Not on file  Intimate Partner Violence: Not on file    Outpatient Medications Prior to Visit  Medication Sig Dispense Refill   tamsulosin (FLOMAX) 0.4 MG CAPS capsule Take 1 capsule by mouth once daily 30 capsule 0   No facility-administered medications prior to visit.    No Known Allergies  ROS Review of Systems  Constitutional:  Negative for chills, diaphoresis, fatigue, fever and unexpected weight change.  HENT:  Positive for congestion and rhinorrhea. Negative for nosebleeds, postnasal drip, sinus pressure and sinus pain.   Respiratory: Negative.    Cardiovascular: Negative.   Gastrointestinal: Negative.   Musculoskeletal:  Positive for neck pain and neck stiffness.  Neurological:  Negative for weakness and numbness.     Objective:    Physical Exam Vitals and nursing note reviewed.  Constitutional:      General: He is not in acute distress.    Appearance: Normal appearance. He is not ill-appearing, toxic-appearing or diaphoretic.  HENT:     Head: Normocephalic and atraumatic.     Right Ear:  Tympanic membrane, ear canal and external ear normal.     Left Ear: Tympanic membrane and ear canal normal.     Nose: Mucosal edema and congestion present.     Right Turbinates: Enlarged and swollen.     Left Turbinates: Enlarged and swollen.     Right Sinus: No maxillary sinus tenderness or frontal sinus tenderness.     Left Sinus: No maxillary sinus tenderness or frontal sinus tenderness.     Mouth/Throat:     Mouth: Mucous membranes are moist.     Pharynx: Oropharynx is clear. No oropharyngeal exudate or posterior oropharyngeal erythema.  Eyes:     General: No scleral icterus.       Right eye: No discharge.        Left eye: No discharge.     Conjunctiva/sclera: Conjunctivae normal.     Pupils: Pupils are equal, round, and reactive to light.  Cardiovascular:     Rate and Rhythm:  Normal rate and regular rhythm.  Pulmonary:     Effort: Pulmonary effort is normal.     Breath sounds: Normal breath sounds.  Neurological:     Mental Status: He is alert and oriented to person, place, and time.  Psychiatric:        Mood and Affect: Mood normal.        Behavior: Behavior normal.    BP 124/70 (BP Location: Right Arm, Patient Position: Sitting, Cuff Size: Normal)    Pulse 67    Temp (!) 97.5 F (36.4 C) (Temporal)    Ht 5\' 5"  (1.651 m)    Wt 181 lb 6.4 oz (82.3 kg)    SpO2 97%    BMI 30.19 kg/m  Wt Readings from Last 3 Encounters:  07/18/21 181 lb 6.4 oz (82.3 kg)  05/16/21 177 lb 3.2 oz (80.4 kg)  02/21/21 174 lb 6.4 oz (79.1 kg)     Health Maintenance Due  Topic Date Due   HIV Screening  Never done   Zoster Vaccines- Shingrix (1 of 2) Never done   COLONOSCOPY (Pts 45-35yrs Insurance coverage will need to be confirmed)  05/19/2021    There are no preventive care reminders to display for this patient.  Lab Results  Component Value Date   TSH 1.63 05/03/2019   Lab Results  Component Value Date   WBC 5.1 02/21/2021   HGB 14.1 02/21/2021   HCT 42.3 02/21/2021   MCV 87.1 02/21/2021   PLT 208.0 02/21/2021   Lab Results  Component Value Date   NA 138 02/21/2021   K 4.4 02/21/2021   CO2 28 02/21/2021   GLUCOSE 88 02/21/2021   BUN 20 02/21/2021   CREATININE 0.93 02/21/2021   BILITOT 0.6 02/21/2021   ALKPHOS 60 02/21/2021   AST 17 02/21/2021   ALT 20 02/21/2021   PROT 6.6 02/21/2021   ALBUMIN 4.2 02/21/2021   CALCIUM 9.0 02/21/2021   GFR 89.64 02/21/2021   Lab Results  Component Value Date   CHOL 171 02/21/2021   Lab Results  Component Value Date   HDL 58.90 02/21/2021   Lab Results  Component Value Date   LDLCALC 96 02/21/2021   Lab Results  Component Value Date   TRIG 77.0 02/21/2021   Lab Results  Component Value Date   CHOLHDL 3 02/21/2021   Lab Results  Component Value Date   HGBA1C 6.2 09/12/2016      Assessment & Plan:    Problem List Items Addressed This Visit   None  Visit Diagnoses     Nasal congestion    -  Primary   Relevant Medications   mometasone (NASONEX) 50 MCG/ACT nasal spray   predniSONE (STERAPRED UNI-PAK 21 TAB) 10 MG (21) TBPK tablet   Strain of neck muscle, initial encounter       Relevant Medications   predniSONE (STERAPRED UNI-PAK 21 TAB) 10 MG (21) TBPK tablet   cyclobenzaprine (FLEXERIL) 10 MG tablet       Meds ordered this encounter  Medications   mometasone (NASONEX) 50 MCG/ACT nasal spray    Sig: Place 2 sprays into the nose daily.    Dispense:  1 each    Refill:  12   predniSONE (STERAPRED UNI-PAK 21 TAB) 10 MG (21) TBPK tablet    Sig: Take 6 today, 5 tomorrow, 4 the next day and then 3, 2, 1 and stop    Dispense:  21 tablet    Refill:  0   DISCONTD: cyclobenzaprine (FLEXERIL) 10 MG tablet    Sig: Take 1 tablet (10 mg total) by mouth 3 (three) times daily as needed for muscle spasms.    Dispense:  30 tablet    Refill:  0   cyclobenzaprine (FLEXERIL) 10 MG tablet    Sig: Take 1 tablet (10 mg total) by mouth at bedtime. As needed.    Dispense:  30 tablet    Refill:  0    Follow-up: Return in about 3 months (around 10/16/2021).  Over the 6-day Dosepak to help his neck and his nasal congestion.  Advised him to discontinue any nasal decongestant.  He will start Nasonex.  Advised that it takes time to work.  May use Flexeril at night.  Was given exercises to help his neck.  Libby Maw, MD

## 2021-07-25 ENCOUNTER — Encounter: Payer: Self-pay | Admitting: Gastroenterology

## 2021-08-08 ENCOUNTER — Other Ambulatory Visit: Payer: Self-pay | Admitting: Family Medicine

## 2021-08-08 DIAGNOSIS — N401 Enlarged prostate with lower urinary tract symptoms: Secondary | ICD-10-CM

## 2021-10-24 ENCOUNTER — Ambulatory Visit: Payer: Commercial Managed Care - PPO | Admitting: Family Medicine

## 2021-11-06 ENCOUNTER — Encounter: Payer: Self-pay | Admitting: Family Medicine

## 2021-11-06 ENCOUNTER — Ambulatory Visit: Payer: Commercial Managed Care - PPO | Admitting: Family Medicine

## 2021-11-06 VITALS — BP 120/74 | HR 60 | Temp 97.5°F | Ht 65.0 in | Wt 178.6 lb

## 2021-11-06 DIAGNOSIS — H6983 Other specified disorders of Eustachian tube, bilateral: Secondary | ICD-10-CM

## 2021-11-06 DIAGNOSIS — H9191 Unspecified hearing loss, right ear: Secondary | ICD-10-CM | POA: Diagnosis not present

## 2021-11-06 DIAGNOSIS — R0683 Snoring: Secondary | ICD-10-CM

## 2021-11-06 DIAGNOSIS — J309 Allergic rhinitis, unspecified: Secondary | ICD-10-CM

## 2021-11-06 DIAGNOSIS — R0981 Nasal congestion: Secondary | ICD-10-CM | POA: Diagnosis not present

## 2021-11-06 DIAGNOSIS — H6993 Unspecified Eustachian tube disorder, bilateral: Secondary | ICD-10-CM

## 2021-11-06 MED ORDER — MOMETASONE FUROATE 50 MCG/ACT NA SUSP: 2.0000 | Freq: Every day | NASAL | 12 refills | Status: DC

## 2021-11-06 MED ORDER — PREDNISONE 10 MG (21) PO TBPK: ORAL_TABLET | ORAL | 0 refills | Status: DC

## 2021-11-06 NOTE — Progress Notes (Signed)
? ?Established Patient Office Visit ? ?Subjective   ?Patient ID: Billy Rocha, male    DOB: 12-Jul-1961  Age: 61 y.o. MRN: [id number] ? ?Chief Complaint  ?Patient presents with  ? Follow-up  ?  3 month follow up, concerns about lots of drooling at night.   ? ? ?HPI evaluation of decreased hearing in the right ear.  Cannot think of no nuclear noise exposure.  Denies ringing.  Denies headache or dizziness.  Ongoing sneezing postnasal drip and ear congestion.  He drools a lot at night in his sleep. ?Status post apnea consultation.  Study has been ordered.  Has not been able to do that as of yet.  He has some bills to pay. ? ? ? ?Review of Systems  ?Constitutional: Negative.   ?HENT:  Positive for congestion and hearing loss. Negative for ear discharge, ear pain, sinus pain, sore throat and tinnitus.   ?Eyes:  Negative for blurred vision, discharge and redness.  ?Respiratory: Negative.  Negative for cough.   ?Cardiovascular: Negative.   ?Gastrointestinal:  Negative for abdominal pain.  ?Genitourinary: Negative.   ?Musculoskeletal: Negative.  Negative for myalgias.  ?Skin:  Negative for rash.  ?Neurological:  Negative for dizziness, tingling, loss of consciousness, weakness and headaches.  ?Endo/Heme/Allergies:  Negative for polydipsia.  ? ?  ?Objective:  ?  ? ?BP 120/74 (BP Location: Right Arm, Patient Position: Sitting, Cuff Size: Normal)   Pulse 60   Temp (!) 97.5 ?F (36.4 ?C) (Temporal)   Ht '5\' 5"'$  (1.651 m)   Wt 178 lb 9.6 oz (81 kg)   SpO2 98%   BMI 29.72 kg/m?  ? ? ?Physical Exam ?Constitutional:   ?   General: He is not in acute distress. ?   Appearance: Normal appearance. He is not ill-appearing, toxic-appearing or diaphoretic.  ?HENT:  ?   Head: Normocephalic and atraumatic.  ?   Right Ear: Tympanic membrane, ear canal and external ear normal. There is no impacted cerumen.  ?   Left Ear: Tympanic membrane, ear canal and external ear normal. There is no impacted cerumen.  ?   Mouth/Throat:  ?    Mouth: Mucous membranes are moist.  ?   Pharynx: Oropharynx is clear. No oropharyngeal exudate or posterior oropharyngeal erythema.  ?Eyes:  ?   General: No scleral icterus.    ?   Right eye: No discharge.     ?   Left eye: No discharge.  ?   Extraocular Movements: Extraocular movements intact.  ?   Conjunctiva/sclera: Conjunctivae normal.  ?   Pupils: Pupils are equal, round, and reactive to light.  ?Cardiovascular:  ?   Rate and Rhythm: Normal rate and regular rhythm.  ?Pulmonary:  ?   Effort: Pulmonary effort is normal. No respiratory distress.  ?   Breath sounds: Normal breath sounds.  ?Abdominal:  ?   General: Bowel sounds are normal.  ?   Tenderness: There is no right CVA tenderness or guarding.  ?Musculoskeletal:  ?   Cervical back: No rigidity or tenderness.  ?Skin: ?   General: Skin is warm and dry.  ?Neurological:  ?   Mental Status: He is alert and oriented to person, place, and time.  ?Psychiatric:     ?   Mood and Affect: Mood normal.     ?   Behavior: Behavior normal.  ? ? ? ?No results found for any visits on 11/06/21. ? ? ? ?The 10-year ASCVD risk score (Arnett DK, et al., 2019) is:  6% ? ?  ?Assessment & Plan:  ? ?Problem List Items Addressed This Visit   ? ?  ? Respiratory  ? Allergic rhinitis  ? Relevant Medications  ? predniSONE (STERAPRED UNI-PAK 21 TAB) 10 MG (21) TBPK tablet  ?  ? Nervous and Auditory  ? Dysfunction of both eustachian tubes  ? Relevant Medications  ? predniSONE (STERAPRED UNI-PAK 21 TAB) 10 MG (21) TBPK tablet  ? Hearing decreased, right  ? Relevant Orders  ? Ambulatory referral to ENT  ?  ? Other  ? Snores  ? Nasal congestion - Primary  ? Relevant Medications  ? predniSONE (STERAPRED UNI-PAK 21 TAB) 10 MG (21) TBPK tablet  ? mometasone (NASONEX) 50 MCG/ACT nasal spray  ? ? ?Return in about 6 months (around 05/08/2022), or if symptoms worsen or fail to improve.  ?Return in 6 months fasting for physical exam.  Please continue Nasonex and avoid nasal decongestants.  6-day  Dosepak.  Eustachian tube exercises demonstrated and patient advised to perform them twice daily.  Referral for hearing evaluation.  Information given on hearing loss, etd, and allergy rhinitis. ? ?Libby Maw, MD ? ?

## 2022-02-08 ENCOUNTER — Emergency Department (HOSPITAL_COMMUNITY)
Admission: EM | Admit: 2022-02-08 | Discharge: 2022-02-08 | Disposition: A | Discharge status: Home / Self Care | Payer: Commercial Managed Care - PPO | Attending: Emergency Medicine | Admitting: Emergency Medicine

## 2022-02-08 ENCOUNTER — Other Ambulatory Visit: Payer: Self-pay

## 2022-02-08 ENCOUNTER — Encounter (HOSPITAL_COMMUNITY): Payer: Self-pay | Admitting: Emergency Medicine

## 2022-02-08 DIAGNOSIS — M545 Low back pain, unspecified: Secondary | ICD-10-CM | POA: Diagnosis present

## 2022-02-08 LAB — CBC WITH DIFFERENTIAL/PLATELET
Abs Immature Granulocytes: 0.03 10*3/uL (ref 0.00–0.07)
Basophils Absolute: 0 10*3/uL (ref 0.0–0.1)
Basophils Relative: 1 %
Eosinophils Absolute: 0 10*3/uL (ref 0.0–0.5)
Eosinophils Relative: 1 %
HCT: 39.6 % (ref 39.0–52.0)
Hemoglobin: 13.5 g/dL (ref 13.0–17.0)
Immature Granulocytes: 0 %
Lymphocytes Relative: 14 %
Lymphs Abs: 1.1 10*3/uL (ref 0.7–4.0)
MCH: 29.5 pg (ref 26.0–34.0)
MCHC: 34.1 g/dL (ref 30.0–36.0)
MCV: 86.7 fL (ref 80.0–100.0)
Monocytes Absolute: 0.6 10*3/uL (ref 0.1–1.0)
Monocytes Relative: 7 %
Neutro Abs: 6.5 10*3/uL (ref 1.7–7.7)
Neutrophils Relative %: 77 %
Platelets: 176 10*3/uL (ref 150–400)
RBC: 4.57 MIL/uL (ref 4.22–5.81)
RDW: 13.4 % (ref 11.5–15.5)
WBC: 8.3 10*3/uL (ref 4.0–10.5)
nRBC: 0 % (ref 0.0–0.2)

## 2022-02-08 LAB — BASIC METABOLIC PANEL
Anion gap: 6 (ref 5–15)
BUN: 14 mg/dL (ref 6–20)
CO2: 27 mmol/L (ref 22–32)
Calcium: 9 mg/dL (ref 8.9–10.3)
Chloride: 104 mmol/L (ref 98–111)
Creatinine, Ser: 1 mg/dL (ref 0.61–1.24)
GFR, Estimated: >60 mL/min (ref >60)
Glucose, Bld: 121 mg/dL — ABNORMAL HIGH (ref 70–99)
Potassium: 4.1 mmol/L (ref 3.5–5.1)
Sodium: 137 mmol/L (ref 135–145)

## 2022-02-08 LAB — URINALYSIS, ROUTINE W REFLEX MICROSCOPIC
Bilirubin Urine: NEGATIVE
Glucose, UA: NEGATIVE mg/dL
Hgb urine dipstick: NEGATIVE
Ketones, ur: NEGATIVE mg/dL
Leukocytes,Ua: NEGATIVE
Nitrite: NEGATIVE
Protein, ur: NEGATIVE mg/dL
Specific Gravity, Urine: 1.015 (ref 1.005–1.030)
pH: 6 (ref 5.0–8.0)

## 2022-02-08 MED ORDER — KETOROLAC TROMETHAMINE 60 MG/2ML IM SOLN
60.0000 mg | Freq: Once | INTRAMUSCULAR | Status: AC
  Administered 2022-02-08: 60 mg via INTRAMUSCULAR
  Filled 2022-02-08: qty 2

## 2022-02-08 MED ORDER — LIDOCAINE 5 % EX PTCH
1.0000 | MEDICATED_PATCH | CUTANEOUS | Status: DC
Start: 2022-02-08 — End: 2022-02-08
  Administered 2022-02-08: 1 via TRANSDERMAL
  Filled 2022-02-08: qty 1

## 2022-02-08 MED ORDER — LIDOCAINE 5 % EX PTCH: 1.0000 | MEDICATED_PATCH | CUTANEOUS | 0 refills | Status: DC

## 2022-02-08 MED ORDER — DEXAMETHASONE 4 MG PO TABS
6.0000 mg | ORAL_TABLET | Freq: Once | ORAL | Status: AC
  Administered 2022-02-08: 6 mg via ORAL
  Filled 2022-02-08: qty 2

## 2022-02-08 MED ORDER — OXYCODONE-ACETAMINOPHEN 5-325 MG PO TABS
1.0000 | ORAL_TABLET | Freq: Once | ORAL | Status: AC
  Administered 2022-02-08: 1 via ORAL
  Filled 2022-02-08: qty 1

## 2022-02-08 MED ORDER — METHYLPREDNISOLONE 4 MG PO TBPK: ORAL_TABLET | ORAL | 0 refills | Status: DC

## 2022-02-08 NOTE — ED Provider Notes (Signed)
[facility] EMERGENCY DEPARTMENT Provider Note   CSN: [id number] Arrival date & time: 02/08/22  0254     History  Chief Complaint  Patient presents with   Back Pain    Billy Rocha is a 61 y.o. male.  Patient here with left lower back pain for the last several days.  Denies any trauma or injury.  History of the same.  Works a Radio producer job.  Nothing makes it worse or better except for movement makes it worse.  He is prescribed steroids and a muscle relaxant yesterday but has not taken anything but a muscle relaxant.  Denies any weakness or numbness.  No loss of bowel or bladder.  No headache, chest pain, shortness of breath.  No pain with urination.  No history of kidney stone.  No history of abdominal surgery.  The history is provided by the patient.       Home Medications Prior to Admission medications   Medication Sig Start Date End Date Taking? Authorizing Provider  lidocaine (LIDODERM) 5 % Place 1 patch onto the skin daily. Remove & Discard patch within 12 hours or as directed by MD 02/08/22  Yes Billy Rocha, Billy [place], DO  methylPREDNISolone (MEDROL DOSEPAK) 4 MG TBPK tablet Follow package insert 02/08/22  Yes Billy Cronkright, DO  cyclobenzaprine (FLEXERIL) 10 MG tablet Take 1 tablet (10 mg total) by mouth at bedtime. As needed. 07/18/21   Billy Maw, MD  mometasone (NASONEX) 50 MCG/ACT nasal spray Place 2 sprays into the nose daily. 11/06/21   Billy Maw, MD  predniSONE (STERAPRED UNI-PAK 21 TAB) 10 MG (21) TBPK tablet Take 6 today, 5 tomorrow, 4 the next day and then 3, 2, 1 and stop 11/06/21   Billy Maw, MD  tamsulosin [facility]) 0.4 MG CAPS capsule Take 1 capsule by mouth once daily 08/08/21   Billy Maw, MD      Allergies    Patient has no known allergies.    Review of Systems   Review of Systems  Physical Exam Updated Vital Signs BP 125/71   Pulse 74   Temp 98.2 F (36.8 C)  (Oral)   Resp 16   SpO2 96%  Physical Exam Vitals and nursing note reviewed.  Constitutional:      General: He is not in acute distress.    Appearance: He is well-developed.  HENT:     Head: Normocephalic and atraumatic.  Eyes:     Conjunctiva/sclera: Conjunctivae normal.  Cardiovascular:     Rate and Rhythm: Normal rate and regular rhythm.     Heart sounds: No murmur heard. Pulmonary:     Effort: Pulmonary effort is normal. No respiratory distress.     Breath sounds: Normal breath sounds.  Abdominal:     Palpations: Abdomen is soft.     Tenderness: There is no abdominal tenderness. There is no right CVA tenderness or left CVA tenderness.  Musculoskeletal:        General: Tenderness present. No swelling.     Cervical back: Neck supple.     Comments: Paraspinal lumbar muscle tenderness with increased tone, pneumonia Spinal pain  Skin:    General: Skin is warm and dry.     Capillary Refill: Capillary refill takes less than 2 seconds.  Neurological:     Mental Status: He is alert.  Psychiatric:        Mood and Affect: Mood normal.     ED Results / Procedures / Treatments  Labs (all labs ordered are listed, but only abnormal results are displayed) Labs Reviewed  BASIC METABOLIC PANEL - Abnormal; Notable for the following components:      Result Value   Glucose, Bld 121 (*)    All other components within normal limits  CBC WITH DIFFERENTIAL/PLATELET  URINALYSIS, ROUTINE W REFLEX MICROSCOPIC    EKG None  Radiology No results found.  Procedures Procedures    Medications Ordered in ED Medications  ketorolac (TORADOL) injection 60 mg (has no administration in time range)  lidocaine (LIDODERM) 5 % 1 patch (has no administration in time range)  dexamethasone (DECADRON) tablet 6 mg (has no administration in time range)  oxyCODONE-acetaminophen (PERCOCET/ROXICET) 5-325 MG per tablet 1 tablet (1 tablet Oral Given 02/08/22 5093)    ED Course/ Medical Decision  Making/ A&P                           Medical Decision Making Amount and/or Complexity of Data Reviewed Labs: ordered.  Risk Prescription drug management.   Billy Rocha is here with left low back pain.  Normal vitals.  No fever.  Differential diagnosis is muscle spasm.  I have no concern for cauda equina, kidney stone, intra-abdominal process.  CBC, CMP, urinalysis was ordered.  Per my review and interpretation of these labs there is no significant anemia, electrolyte abnormality, leukocytosis.  Urinalysis negative for infection.  There is no blood in the urine.  No concern for kidney stone.  He is neurologically and neurovascularly intact.  Patient was given Toradol shot, lidocaine patch, Decadron.  Prescribed Medrol Dosepak and lidocaine patches.  Recommend continued use of Tylenol and ibuprofen at home.  Discharged in good condition.  This chart was dictated using voice recognition software.  Despite best efforts to proofread,  errors can occur which can change the documentation meaning.         Final Clinical Impression(s) / ED Diagnoses Final diagnoses:  Acute left-sided low back pain without sciatica    Rx / DC Orders ED Discharge Orders          Ordered    lidocaine (LIDODERM) 5 %  Every 24 hours        02/08/22 0739    methylPREDNISolone (MEDROL DOSEPAK) 4 MG TBPK tablet        02/08/22 [place], [place], DO 02/08/22 [phone]

## 2022-02-08 NOTE — ED Triage Notes (Signed)
Patient reports persistent left back pain for several days worse with movement and changing positions unrelieved by prescription medications , denies hematuria or injury .

## 2022-02-19 ENCOUNTER — Other Ambulatory Visit: Payer: Self-pay | Admitting: Family Medicine

## 2022-02-19 DIAGNOSIS — N401 Enlarged prostate with lower urinary tract symptoms: Secondary | ICD-10-CM

## 2022-02-26 DIAGNOSIS — H903 Sensorineural hearing loss, bilateral: Secondary | ICD-10-CM | POA: Insufficient documentation

## 2022-05-08 ENCOUNTER — Ambulatory Visit: Payer: Commercial Managed Care - PPO | Admitting: Family Medicine

## 2022-05-13 ENCOUNTER — Other Ambulatory Visit: Payer: Self-pay | Admitting: Family Medicine

## 2022-05-13 DIAGNOSIS — N401 Enlarged prostate with lower urinary tract symptoms: Secondary | ICD-10-CM

## 2022-06-11 ENCOUNTER — Ambulatory Visit: Payer: Commercial Managed Care - PPO | Admitting: Family Medicine

## 2022-06-11 ENCOUNTER — Encounter: Payer: Self-pay | Admitting: Family Medicine

## 2022-06-11 VITALS — BP 118/72 | HR 67 | Temp 96.8°F | Ht 65.0 in | Wt 184.4 lb

## 2022-06-11 DIAGNOSIS — R0683 Snoring: Secondary | ICD-10-CM

## 2022-06-11 DIAGNOSIS — Z0000 Encounter for general adult medical examination without abnormal findings: Secondary | ICD-10-CM

## 2022-06-11 DIAGNOSIS — N401 Enlarged prostate with lower urinary tract symptoms: Secondary | ICD-10-CM

## 2022-06-11 DIAGNOSIS — R350 Frequency of micturition: Secondary | ICD-10-CM | POA: Diagnosis not present

## 2022-06-11 DIAGNOSIS — R7303 Prediabetes: Secondary | ICD-10-CM

## 2022-06-11 LAB — HEMOGLOBIN A1C: Hgb A1c MFr Bld: 6.4 % (ref 4.6–6.5)

## 2022-06-11 LAB — LIPID PANEL
Cholesterol: 216 mg/dL — ABNORMAL HIGH (ref 0–200)
HDL: 72.6 mg/dL (ref >39.00)
LDL Cholesterol: 127 mg/dL — ABNORMAL HIGH (ref 0–99)
NonHDL: 143.59
Total CHOL/HDL Ratio: 3
Triglycerides: 81 mg/dL (ref 0.0–149.0)
VLDL: 16.2 mg/dL (ref 0.0–40.0)

## 2022-06-11 LAB — COMPREHENSIVE METABOLIC PANEL
ALT: 44 U/L (ref 0–53)
AST: 26 U/L (ref 0–37)
Albumin: 4.6 g/dL (ref 3.5–5.2)
Alkaline Phosphatase: 56 U/L (ref 39–117)
BUN: 21 mg/dL (ref 6–23)
CO2: 32 mEq/L (ref 19–32)
Calcium: 9.3 mg/dL (ref 8.4–10.5)
Chloride: 103 mEq/L (ref 96–112)
Creatinine, Ser: 1.04 mg/dL (ref 0.40–1.50)
GFR: 77.67 mL/min (ref >60.00)
Glucose, Bld: 94 mg/dL (ref 70–99)
Potassium: 4.6 mEq/L (ref 3.5–5.1)
Sodium: 139 mEq/L (ref 135–145)
Total Bilirubin: 0.6 mg/dL (ref 0.2–1.2)
Total Protein: 7.1 g/dL (ref 6.0–8.3)

## 2022-06-11 LAB — URINALYSIS, ROUTINE W REFLEX MICROSCOPIC
Bilirubin Urine: NEGATIVE
Hgb urine dipstick: NEGATIVE
Ketones, ur: NEGATIVE
Leukocytes,Ua: NEGATIVE
Nitrite: NEGATIVE
RBC / HPF: NONE SEEN (ref 0–?)
Specific Gravity, Urine: 1.02 (ref 1.000–1.030)
Total Protein, Urine: NEGATIVE
Urine Glucose: NEGATIVE
Urobilinogen, UA: 0.2 (ref 0.0–1.0)
pH: 7 (ref 5.0–8.0)

## 2022-06-11 LAB — CBC
HCT: 46.8 % (ref 39.0–52.0)
Hemoglobin: 15.6 g/dL (ref 13.0–17.0)
MCHC: 33.2 g/dL (ref 30.0–36.0)
MCV: 87.8 fl (ref 78.0–100.0)
Platelets: 215 10*3/uL (ref 150.0–400.0)
RBC: 5.33 Mil/uL (ref 4.22–5.81)
RDW: 14 % (ref 11.5–15.5)
WBC: 5.8 10*3/uL (ref 4.0–10.5)

## 2022-06-11 LAB — PSA: PSA: 0.82 ng/mL (ref 0.10–4.00)

## 2022-06-11 MED ORDER — TAMSULOSIN HCL 0.4 MG PO CAPS: 0.4000 mg | ORAL_CAPSULE | Freq: Every day | ORAL | 3 refills | Status: DC

## 2022-06-11 NOTE — Progress Notes (Addendum)
Established Patient Office Visit  Subjective   Patient ID: Billy Rocha, male    DOB: [birth year]  Age: 61 y.o. MRN: [id number]  Chief Complaint  Patient presents with   Follow-up    6 month follow up, no concerns. Patient fasting.     HPI for physical exam and follow-up of BPH.  Tamsulosin has helped.  Force of stream has improved.  He is due for follow-up colonoscopy.  Has appointment with sleep medicine tomorrow.  Continues to snore loudly.  Regularly exercises by riding his bike.  He has access to dental care and does go for regular cleanings.    Review of Systems  Constitutional: Negative.   HENT: Negative.    Eyes:  Negative for blurred vision, discharge and redness.  Respiratory: Negative.    Cardiovascular: Negative.   Gastrointestinal:  Negative for abdominal pain.  Genitourinary: Negative.   Musculoskeletal: Negative.  Negative for myalgias.  Skin:  Negative for rash.  Neurological:  Negative for tingling, loss of consciousness and weakness.  Endo/Heme/Allergies:  Negative for polydipsia.      Objective:     BP 118/72 (BP Location: Right Arm, Patient Position: Sitting, Cuff Size: Large)   Pulse 67   Temp (!) 96.8 F (36 C) (Temporal)   Ht '5\' 5"'$  (1.651 m)   Wt 184 lb 6.4 oz (83.6 kg)   SpO2 96%   BMI 30.69 kg/m    Physical Exam Constitutional:      General: He is not in acute distress.    Appearance: Normal appearance. He is not ill-appearing, toxic-appearing or diaphoretic.  HENT:     Head: Normocephalic and atraumatic.     Right Ear: External ear normal.     Left Ear: External ear normal.     Mouth/Throat:     Mouth: Mucous membranes are moist.     Pharynx: Oropharynx is clear. No oropharyngeal exudate or posterior oropharyngeal erythema.  Eyes:     General: No scleral icterus.       Right eye: No discharge.        Left eye: No discharge.     Extraocular Movements: Extraocular movements intact.     Conjunctiva/sclera: Conjunctivae  normal.     Pupils: Pupils are equal, round, and reactive to light.  Cardiovascular:     Rate and Rhythm: Normal rate and regular rhythm.  Pulmonary:     Effort: Pulmonary effort is normal. No respiratory distress.     Breath sounds: Normal breath sounds.  Abdominal:     General: Bowel sounds are normal. There is no distension.     Tenderness: There is no abdominal tenderness. There is no guarding.  Genitourinary:    Comments: Declines exam today.  Musculoskeletal:     Cervical back: No rigidity or tenderness.  Skin:    General: Skin is warm and dry.  Neurological:     Mental Status: He is alert and oriented to person, place, and time.  Psychiatric:        Mood and Affect: Mood normal.        Behavior: Behavior normal.      Results for orders placed or performed in visit on 06/11/22  CBC  Result Value Ref Range   WBC 5.8 4.0 - 10.5 K/uL   RBC 5.33 4.22 - 5.81 Mil/uL   Platelets 215.0 150.0 - 400.0 K/uL   Hemoglobin 15.6 13.0 - 17.0 g/dL   HCT 46.8 39.0 - 52.0 %   MCV 87.8 78.0 - 100.0  fl   MCHC 33.2 30.0 - 36.0 g/dL   RDW 14.0 11.5 - 15.5 %  Comprehensive metabolic panel  Result Value Ref Range   Sodium 139 135 - 145 mEq/L   Potassium 4.6 3.5 - 5.1 mEq/L   Chloride 103 96 - 112 mEq/L   CO2 32 19 - 32 mEq/L   Glucose, Bld 94 70 - 99 mg/dL   BUN 21 6 - 23 mg/dL   Creatinine, Ser 1.04 0.40 - 1.50 mg/dL   Total Bilirubin 0.6 0.2 - 1.2 mg/dL   Alkaline Phosphatase 56 39 - 117 U/L   AST 26 0 - 37 U/L   ALT 44 0 - 53 U/L   Total Protein 7.1 6.0 - 8.3 g/dL   Albumin 4.6 3.5 - 5.2 g/dL   GFR 77.67 >60.00 mL/min   Calcium 9.3 8.4 - 10.5 mg/dL  Lipid panel  Result Value Ref Range   Cholesterol 216 (H) 0 - 200 mg/dL   Triglycerides 81.0 0.0 - 149.0 mg/dL   HDL 72.60 >39.00 mg/dL   VLDL 16.2 0.0 - 40.0 mg/dL   LDL Cholesterol 127 (H) 0 - 99 mg/dL   Total CHOL/HDL Ratio 3    NonHDL 143.59   Hemoglobin A1c  Result Value Ref Range   Hgb A1c MFr Bld 6.4 4.6 - 6.5 %   PSA  Result Value Ref Range   PSA 0.82 0.10 - 4.00 ng/mL  Urinalysis, Routine w reflex microscopic  Result Value Ref Range   Color, Urine YELLOW Yellow;Lt. Yellow;Straw;Dark Yellow;Amber;Green;Red;Brown   APPearance CLEAR Clear;Turbid;Slightly Cloudy;Cloudy   Specific Gravity, Urine 1.020 1.000 - 1.030   pH 7.0 5.0 - 8.0   Total Protein, Urine NEGATIVE Negative   Urine Glucose NEGATIVE Negative   Ketones, ur NEGATIVE Negative   Bilirubin Urine NEGATIVE Negative   Hgb urine dipstick NEGATIVE Negative   Urobilinogen, UA 0.2 0.0 - 1.0   Leukocytes,Ua NEGATIVE Negative   Nitrite NEGATIVE Negative   WBC, UA 0-2/hpf 0-2/hpf   RBC / HPF none seen 0-2/hpf      The 10-year ASCVD risk score (Arnett DK, et al., 2019) is: 6.5%    Assessment & Plan:   Problem List Items Addressed This Visit       Other   Healthcare maintenance - Primary   Relevant Orders   CBC (Completed)   Comprehensive metabolic panel (Completed)   Lipid panel (Completed)   Hemoglobin A1c (Completed)   PSA (Completed)   Urinalysis, Routine w reflex microscopic (Completed)   Ambulatory referral to Gastroenterology   Benign prostatic hyperplasia with urinary frequency   Relevant Medications   tamsulosin (FLOMAX) 0.4 MG CAPS capsule   Snores   Prediabetes   Relevant Medications   metFORMIN (GLUCOPHAGE-XR) 500 MG 24 hr tablet    Return in about 3 months (around 09/11/2022), or if symptoms worsen or fail to improve.  Above fasting blood work drawn today.  Referral back to GI for follow-up of colon polyposis.  Recommendations will be made pending results of today's blood work.  Information was given on BPH, health maintenance and preventative care all in Spanish.  Libby Maw, MD   12/15 addendum: Blood work from above revealed hemoglobin A1c in the prediabetic range.  Patient requested that medication be called to the pharmacy.  Will start Glucophage Exar 500 mg at at bedtime.  CMA warned him about  cramping and loose stools as a possible side effect.  He will let us know if it persists over  2 weeks.  Follow-up in 3 months

## 2022-06-12 ENCOUNTER — Encounter: Payer: Self-pay | Admitting: Neurology

## 2022-06-12 ENCOUNTER — Ambulatory Visit (INDEPENDENT_AMBULATORY_CARE_PROVIDER_SITE_OTHER): Payer: Commercial Managed Care - PPO | Admitting: Neurology

## 2022-06-12 VITALS — BP 116/65 | HR 69 | Ht 65.0 in | Wt 187.4 lb

## 2022-06-12 DIAGNOSIS — G4730 Sleep apnea, unspecified: Secondary | ICD-10-CM | POA: Diagnosis not present

## 2022-06-12 DIAGNOSIS — R0683 Snoring: Secondary | ICD-10-CM | POA: Diagnosis not present

## 2022-06-12 DIAGNOSIS — E669 Obesity, unspecified: Secondary | ICD-10-CM

## 2022-06-12 DIAGNOSIS — G4719 Other hypersomnia: Secondary | ICD-10-CM | POA: Diagnosis not present

## 2022-06-12 DIAGNOSIS — Z820 Family history of epilepsy and other diseases of the nervous system: Secondary | ICD-10-CM

## 2022-06-12 DIAGNOSIS — R0681 Apnea, not elsewhere classified: Secondary | ICD-10-CM

## 2022-06-12 DIAGNOSIS — R351 Nocturia: Secondary | ICD-10-CM

## 2022-06-12 NOTE — Progress Notes (Signed)
Subjective:    Patient ID: Billy Rocha is a 61 y.o. male.  HPI    Interim history:  Billy Rocha is a 61 year old right-handed gentleman with an underlying medical history of arthritis, BPH and overweight state, who presents for evaluation of his sleep disturbance.  I had evaluated him a year ago for similar concerns, I had suggested a sleep study for concern for underlying obstructive sleep apnea.  He did not pursue it at the time.  Today, 06/12/2022: He reports significant snoring and excessive daytime somnolence as well as witnessed apneas.  He reports interim weight gain of about 10 pounds within the past year.  He reports that he did not pursue sleep testing last year but would like to pursue it now.  He reports that his brother has sleep apnea and has a CPAP machine.  He would be willing to consider it.  Bedtime is generally around 9 and rise time around 4:30 AM when he works.  He works as a Programme researcher, broadcasting/film/video.  When he does not work the next day he will sleep till 7:30 AM or 8.  He has nocturia about 2-3 times per average night.  Flomax has helped reduce his nocturia.  His Epworth sleepiness score is 10 out of 24, fatigue severity score is 30 out of 63.  The patient's allergies, current medications, family history, past medical history, past social history, past surgical history and problem list were reviewed and updated as appropriate.   Previously:   05/16/21: (He) reports snoring, witnessed apneas per wife, and daytime somnolence. I reviewed your office note from 02/21/2021.  His Epworth sleepiness score is 13 out of 24, fatigue severity score is 39 out of 63.  He lives with his wife and his in-laws.  He has 3 grown children.  He works as a Programme researcher, broadcasting/film/video.  He has been stable with his weight.  He has had some morning headaches which are typically in the back of his head.  He has had recent stress, had to take his mother-in-law to the emergency room a few days ago.  He is a non-smoker and  drinks alcohol very occasionally, caffeine in the form of 1 cup of tea or coffee per day on average.  His brother and sister has a CPAP machine.  They have mobility within the household.  He does not wake up fully rested.  He has nocturia about 2-3 times per average night.  Bedtime is generally around 9 and rise time around 4:30 AM.  He typically has to be at work around 5 AM.  Snoring is loud and typically quite disturbing to his wife.    His Past Medical History Is Significant For: Past Medical History:  Diagnosis Date   Arthritis     His Past Surgical History Is Significant For: Past Surgical History:  Procedure Laterality Date   CYST EXCISION Left 1993    His Family History Is Significant For: Family History  Problem Relation Age of Onset   Cancer Mother    Diabetes Mother    Heart disease Mother    Diabetes Sister    Liver disease Sister    Diabetes Brother    Obesity Brother    Obesity Brother    Obesity Brother    Colon cancer Neg Hx    Colon polyps Neg Hx    Esophageal cancer Neg Hx    Rectal cancer Neg Hx    Stomach cancer Neg Hx     His Social History Is Significant For:  Social History   Socioeconomic History   Marital status: Married    Spouse name: Not on file   Number of children: Not on file   Years of education: Not on file   Highest education level: Not on file  Occupational History   Not on file  Tobacco Use   Smoking status: Never   Smokeless tobacco: Never  Vaping Use   Vaping Use: Never used  Substance and Sexual Activity   Alcohol use: Yes    Alcohol/week: 1.0 standard drink of alcohol    Types: 1 Cans of beer per week    Comment: rare   Drug use: No   Sexual activity: Yes  Other Topics Concern   Not on file  Social History Narrative   Caffiene 1 cup coffee in am,    Working : Programme researcher, broadcasting/film/video   Married 3 kids.    Grand- daughter 1.    Social Determinants of Health   Financial Resource Strain: Not on file  Food Insecurity: Not on file   Transportation Needs: Not on file  Physical Activity: Not on file  Stress: Not on file  Social Connections: Not on file    His Allergies Are:  No Known Allergies:   His Current Medications Are:  Outpatient Encounter Medications as of 06/12/2022  Medication Sig   tamsulosin (FLOMAX) 0.4 MG CAPS capsule Take 1 capsule (0.4 mg total) by mouth daily.   No facility-administered encounter medications on file as of 06/12/2022.  :  Review of Systems:  Out of a complete 14 point review of systems, all are reviewed and negative with the exception of these symptoms as listed below:  Review of Systems  Neurological:        Restart process for PAP therapy.  Snoring, witnessed apnea, daytime fatigue.  ESS   10 FSS 30.    Objective:  Neurological Exam  Physical Exam Physical Examination:   Vitals:   06/12/22 1022  BP: 116/65  Pulse: 69    General Examination: The patient is a very pleasant 61 y.o. male in no acute distress. He appears well-developed and well-nourished and well groomed.   HEENT: Normocephalic, atraumatic, pupils are equal, round and reactive to light, extraocular tracking is good without limitation to gaze excursion or nystagmus noted. Hearing is grossly intact. Face is symmetric with normal facial animation. Speech is clear without dysarthria, hypophonia or voice tremor. Neck is supple with full range of passive and active motion. There are no carotid bruits on auscultation. Oropharynx exam reveals: No significant mouth dryness, good dental hygiene, moderate airway crowding secondary to larger uvula, tonsils of about 1 to 2+ bilaterally.  Mallampati class III.  Neck circumference of 16 inches.  Tongue protrudes centrally and palate elevates symmetrically.  Minimal to mild overbite.    Chest: Clear to auscultation without wheezing, rhonchi or crackles noted.   Heart: S1+S2+0, regular and normal without murmurs, rubs or gallops noted.    Abdomen: Soft, non-tender and  non-distended with normal bowel sounds appreciated on auscultation.   Extremities: There is no pitting edema in the distal lower extremities bilaterally.    Skin: Warm and dry without trophic changes noted.    Musculoskeletal: exam reveals no obvious joint deformities.    Neurologically:  Mental status: The patient is awake, alert and oriented in all 4 spheres. His immediate and remote memory, attention, language skills and fund of knowledge are appropriate. There is no evidence of aphasia, agnosia, apraxia or anomia. Speech is clear with normal  prosody and enunciation. Thought process is linear. Mood is normal and affect is normal.  Cranial nerves II - XII are as described above under HEENT exam.  Motor exam: Normal bulk, strength and tone is noted. There is no obvious action or resting tremor. Fine motor skills and coordination: grossly intact.  Cerebellar testing: No dysmetria or intention tremor. There is no truncal or gait ataxia.  Sensory exam: intact to light touch in the upper and lower extremities.  Gait, station and balance: He stands easily. No veering to one side is noted. No leaning to one side is noted. Posture is age-appropriate and stance is narrow based. Gait shows normal stride length and normal pace. No problems turning are noted.                  Assessment and Plan:  In summary, Billy Rocha is a very pleasant 61 year old male with an underlying medical history of arthritis, BPH and overweight state, who presents for evaluation of his sleep disturbance, concern for underlying obstructive sleep apnea (OSA). I again discussed with the patient about the possible diagnosis of OSA, its prognosis and treatment options. We talked about medical treatments, surgical interventions and non-pharmacological approaches. I explained in particular the risks and ramifications of untreated moderate to severe OSA, especially with respect to developing cardiovascular disease down the  Road, including congestive heart failure, difficult to treat hypertension, cardiac arrhythmias, or stroke. Even type 2 diabetes has, in part, been linked to untreated OSA. Symptoms of untreated OSA include daytime sleepiness, memory problems, mood irritability and mood disorder such as depression and anxiety, lack of energy, as well as recurrent headaches, especially morning headaches. We talked about trying to maintain a healthy lifestyle in general, as well as the importance of weight control. We also talked about the importance of good sleep hygiene. I recommended the following at this time: sleep study.  I outlined the difference between a laboratory attended sleep study versus home sleep test.  He is willing to proceed.  We will plan a follow-up after testing, he is willing to consider CPAP or AutoPap therapy.  I answered all his questions today and he was in agreement with our plan.  I spent 30 minutes in total face-to-face time and in reviewing records during pre-charting, more than 50% of which was spent in counseling and coordination of care, reviewing test results, reviewing medications and treatment regimen and/or in discussing or reviewing the diagnosis of OSA, the prognosis and treatment options. Pertinent laboratory and imaging test results that were available during this visit with the patient were reviewed by me and considered in my medical decision making (see chart for details).

## 2022-06-12 NOTE — Patient Instructions (Signed)
As discussed, I will reorder your sleep study.  We will call you to schedule your study and plan to follow-up after testing.  If you have sleep apnea, we will consider treatment with a CPAP or AutoPap machine.

## 2022-06-26 ENCOUNTER — Telehealth: Payer: Self-pay | Admitting: Family Medicine

## 2022-06-26 NOTE — Telephone Encounter (Signed)
Returned call went over labs with patient. Was something going to be sent in for prediabetes?  Per patient he will pick up Rx and start to help with levels.

## 2022-06-26 NOTE — Telephone Encounter (Signed)
Pt stated you lvm for him to call about his lab results. Please give the pt a call

## 2022-06-27 DIAGNOSIS — R7303 Prediabetes: Secondary | ICD-10-CM | POA: Insufficient documentation

## 2022-06-27 MED ORDER — METFORMIN HCL ER 500 MG PO TB24
ORAL_TABLET | ORAL | 2 refills | Status: DC
Start: 2022-06-27 — End: 2022-09-23

## 2022-06-27 NOTE — Telephone Encounter (Signed)
Patient aware of message below and will pick up Rx today

## 2022-06-27 NOTE — Addendum Note (Signed)
Addended by: Jon Billings on: 06/27/2022 01:13 PM   Modules accepted: Orders, Level of Service

## 2022-09-23 ENCOUNTER — Other Ambulatory Visit: Payer: Self-pay | Admitting: Family Medicine

## 2022-09-23 DIAGNOSIS — R7303 Prediabetes: Secondary | ICD-10-CM

## 2022-09-23 NOTE — Telephone Encounter (Signed)
Pt scheduled for 10/01/22 for prediabetes f/u.

## 2022-10-01 ENCOUNTER — Ambulatory Visit: Payer: Commercial Managed Care - PPO | Admitting: Family Medicine

## 2022-10-01 ENCOUNTER — Encounter: Payer: Self-pay | Admitting: Family Medicine

## 2022-10-01 VITALS — BP 122/72 | HR 61 | Temp 97.7°F | Ht 65.0 in | Wt 184.2 lb

## 2022-10-01 DIAGNOSIS — K219 Gastro-esophageal reflux disease without esophagitis: Secondary | ICD-10-CM | POA: Diagnosis not present

## 2022-10-01 DIAGNOSIS — R0683 Snoring: Secondary | ICD-10-CM | POA: Diagnosis not present

## 2022-10-01 DIAGNOSIS — R7303 Prediabetes: Secondary | ICD-10-CM

## 2022-10-01 LAB — BASIC METABOLIC PANEL
BUN: 16 mg/dL (ref 6–23)
CO2: 28 mEq/L (ref 19–32)
Calcium: 9.2 mg/dL (ref 8.4–10.5)
Chloride: 104 mEq/L (ref 96–112)
Creatinine, Ser: 0.88 mg/dL (ref 0.40–1.50)
GFR: 92.82 mL/min (ref >60.00)
Glucose, Bld: 87 mg/dL (ref 70–99)
Potassium: 4.8 mEq/L (ref 3.5–5.1)
Sodium: 137 mEq/L (ref 135–145)

## 2022-10-01 LAB — HEMOGLOBIN A1C: Hgb A1c MFr Bld: 6.3 % (ref 4.6–6.5)

## 2022-10-01 MED ORDER — PANTOPRAZOLE SODIUM 20 MG PO TBEC: 20.0000 mg | DELAYED_RELEASE_TABLET | Freq: Every day | ORAL | 0 refills | Status: DC

## 2022-10-01 MED ORDER — METFORMIN HCL ER 500 MG PO TB24: ORAL_TABLET | ORAL | 1 refills | Status: DC

## 2022-10-01 NOTE — Progress Notes (Addendum)
Established Patient Office Visit   Subjective:  Patient ID: Billy Rocha, male    DOB: May 05, 1961  Age: 62 y.o. MRN: [id number]  Chief Complaint  Patient presents with   Medical Management of Chronic Issues    Follow up on prediabetes would like to discuss weaning off medication. Concerns about reflux issues sometimes causing SOB while sleeping.    HPI Encounter Diagnoses  Name Primary?   Prediabetes Yes   Snores    Gastroesophageal reflux disease, unspecified whether esophagitis present    For follow-up of prediabetes.  Taking the metformin without issue.  He has a sleep study scheduled in the sleep lab on this coming Tuesday.  Last colonoscopy was in November 2019.  Polyps were seen.  Follow-up was recommended in 5 years.  He is unaware of reflux.  He did have nasolaryngoscopy where evidence for reflux was noted.  He does snore.  He has experienced episodes of waking up in a panic and out of breath.   Review of Systems  Constitutional: Negative.   HENT: Negative.    Eyes:  Negative for blurred vision, discharge and redness.  Respiratory: Negative.    Cardiovascular: Negative.   Gastrointestinal:  Negative for abdominal pain.  Genitourinary: Negative.   Musculoskeletal: Negative.  Negative for myalgias.  Skin:  Negative for rash.  Neurological:  Negative for tingling, loss of consciousness and weakness.  Endo/Heme/Allergies:  Negative for polydipsia.     Current Outpatient Medications:    pantoprazole (PROTONIX) 20 MG tablet, Take 1 tablet (20 mg total) by mouth daily., Disp: 90 tablet, Rfl: 0   tamsulosin (FLOMAX) 0.4 MG CAPS capsule, Take 1 capsule (0.4 mg total) by mouth daily., Disp: 90 capsule, Rfl: 3   metFORMIN (GLUCOPHAGE-XR) 500 MG 24 hr tablet, TAKE 1 TABLET BY MOUTH EVERY NIGHT AT BEDTIME, Disp: 90 tablet, Rfl: 1   Objective:     BP 122/72 (BP Location: Right Arm, Patient Position: Sitting, Cuff Size: Normal)   Pulse 61   Temp 97.7 F (36.5 C)  (Temporal)   Ht 5\' 5"  (1.651 m)   Wt 184 lb 3.2 oz (83.6 kg)   SpO2 97%   BMI 30.65 kg/m  Wt Readings from Last 3 Encounters:  10/01/22 184 lb 3.2 oz (83.6 kg)  06/12/22 187 lb 6.4 oz (85 kg)  06/11/22 184 lb 6.4 oz (83.6 kg)      Physical Exam Constitutional:      General: He is not in acute distress.    Appearance: Normal appearance. He is not ill-appearing, toxic-appearing or diaphoretic.  HENT:     Head: Normocephalic and atraumatic.     Right Ear: External ear normal.     Left Ear: External ear normal.  Eyes:     General: No scleral icterus.       Right eye: No discharge.        Left eye: No discharge.     Extraocular Movements: Extraocular movements intact.     Conjunctiva/sclera: Conjunctivae normal.  Pulmonary:     Effort: Pulmonary effort is normal. No respiratory distress.  Skin:    General: Skin is warm and dry.  Neurological:     Mental Status: He is alert and oriented to person, place, and time.  Psychiatric:        Mood and Affect: Mood normal.        Behavior: Behavior normal.      No results found for any visits on 10/01/22.    The 10-year ASCVD  risk score (Arnett DK, et al., 2019) is: 7.1%    Assessment & Plan:   Prediabetes -     Basic metabolic panel -     Hemoglobin A1c -     metFORMIN HCl ER; TAKE 1 TABLET BY MOUTH EVERY NIGHT AT BEDTIME  Dispense: 90 tablet; Refill: 1  Snores  Gastroesophageal reflux disease, unspecified whether esophagitis present -     Pantoprazole Sodium; Take 1 tablet (20 mg total) by mouth daily.  Dispense: 90 tablet; Refill: 0    No follow-ups on file.   Sleep study on Tuesday.  Continue metformin.  Advised exercise and weight loss control of diabetes, reflux and probable apnea.  Will start Protonix 20 mg for what sounds like silent reflux.  Needs follow-up colonoscopy in November. Mliss Sax, MD   5/9 addendum: Sleep medicine has tried to contact him several times even using interpreter but he  has not responded.  Needs CPAP.

## 2022-10-06 ENCOUNTER — Telehealth: Payer: Self-pay | Admitting: Family Medicine

## 2022-10-06 NOTE — Telephone Encounter (Signed)
Called patient after speaking with pharmacy patient verbally understood Rx ready for pick up with $10 co-pay

## 2022-10-06 NOTE — Telephone Encounter (Signed)
Pt said he still waiting on  his new medication be authorized so he can pick up pharmacy. Pt said he is returning your call please call him back

## 2022-10-07 ENCOUNTER — Ambulatory Visit (INDEPENDENT_AMBULATORY_CARE_PROVIDER_SITE_OTHER): Payer: Commercial Managed Care - PPO | Admitting: Neurology

## 2022-10-07 DIAGNOSIS — R0683 Snoring: Secondary | ICD-10-CM | POA: Diagnosis not present

## 2022-10-07 DIAGNOSIS — Z820 Family history of epilepsy and other diseases of the nervous system: Secondary | ICD-10-CM

## 2022-10-07 DIAGNOSIS — E669 Obesity, unspecified: Secondary | ICD-10-CM

## 2022-10-07 DIAGNOSIS — G4730 Sleep apnea, unspecified: Secondary | ICD-10-CM

## 2022-10-07 DIAGNOSIS — R0681 Apnea, not elsewhere classified: Secondary | ICD-10-CM

## 2022-10-07 DIAGNOSIS — R351 Nocturia: Secondary | ICD-10-CM

## 2022-10-07 DIAGNOSIS — G4719 Other hypersomnia: Secondary | ICD-10-CM

## 2022-10-07 DIAGNOSIS — G4733 Obstructive sleep apnea (adult) (pediatric): Secondary | ICD-10-CM

## 2022-10-07 DIAGNOSIS — G4720 Circadian rhythm sleep disorder, unspecified type: Secondary | ICD-10-CM

## 2022-10-14 NOTE — Procedures (Unsigned)
Physician Interpretation:   Referred by: Libby Maw, MD   History and Indication for Testing: 62 year old right-handed gentleman with an underlying medical history of arthritis, BPH and overweight state, who reports snoring and excessive daytime somnolence as well as witnessed apneas.  He has had weight gain within the past year in the realm of 10 pounds.  His Epworth sleepiness score is 10 out of 24, fatigue severity score is 30 out of 63.  EEG: Review of the EEG showed no abnormal electrical discharges and symmetrical bihemispheric findings.    EKG: The EKG revealed normal sinus rhythm (NSR).   AUDIO/VIDEO REVIEW: The audio and video review did not show any abnormal or unusual behaviors, movements, phonations or vocalizations. The patient took 1 restroom break.  Mild snoring was detected.  POST-STUDY QUESTIONNAIRE: Post study, the patient indicated, that sleep was the same as usual.  IMPRESSION:   Moderate Obstructive Sleep Apnea (OSA) Dysfunctions associated with sleep stages or arousal from sleep  RECOMMENDATIONS:   This study demonstrates moderate to severe obstructive sleep apnea, with a total AHI of 27.7/hour, REM AHI of 26.6/hour, supine AHI of 62.7/hour and O2 nadir of 74% (during supine REM sleep). Treatment with a positive airway pressure (PAP) device is recommended.  The patient will be advised to proceed with home autoPAP therapy for now.  A laboratory attended, full night PAP-titration study can be considered down the road, to optimize therapy settings, mask fit, monitoring of tolerance and of proper oxygen saturations. Other treatment options may be limited, and may include (generally speaking) surgical options in selected patients.  Concomitant weight loss is highly recommended (where clinically appropriate). Please note that untreated obstructive sleep apnea may carry additional perioperative morbidity. Patients with significant obstructive sleep apnea should receive  perioperative PAP therapy and the surgeons and particularly the anesthesiologist should be informed of the diagnosis and the severity of the sleep disordered breathing. This study shows sleep fragmentation and abnormal sleep stage percentages; these are nonspecific findings and per se do not signify an intrinsic sleep disorder or a cause for the patient's sleep-related symptoms. Causes include (but are not limited to) the first night effect of the sleep study, circadian rhythm disturbances, medication effect or an underlying mood disorder or medical problem.  The patient should be cautioned not to drive, work at heights, or operate dangerous or heavy equipment when tired or sleepy. Review and reiteration of good sleep hygiene measures should be pursued with any patient. The patient will be seen in follow-up in the sleep clinic at [facility] for discussion of the test results, symptom and treatment compliance review, further management strategies, etc. The patient and the referring provider will be notified of the test results.   I certify that I have reviewed the entire raw data recording prior to the issuance of this report in accordance with the Standards of Accreditation of the American Academy of Sleep Medicine (AASM).  Star Age, MD, PhD Medical Director, [facility]) [place], ABPN (Neurology and Sleep)   Technical Report:   ***

## 2022-10-15 NOTE — Addendum Note (Signed)
Addended by: Star Age on: 10/15/2022 05:04 PM   Modules accepted: Orders

## 2022-10-16 ENCOUNTER — Telehealth: Payer: Self-pay | Admitting: *Deleted

## 2022-10-16 NOTE — Telephone Encounter (Signed)
-----   Message from Star Age, MD sent at 10/15/2022  5:04 PM EDT ----- Patient referred by PCP, seen by me on 06/12/22, patient had PSG on 10/07/22.    Please call and notify the patient that the sleep study showed obstructive sleep apnea in the moderate range. I recommend treatment in the form of autoPAP, which means, that we don't have to bring him back in for a sleep study with CPAP, but will let him start using a so called autoPAP machine at home, which is a CPAP-like machine with self-adjusting pressures. We will send the order to a local DME company (of his choice, or as per insurance requirement). The DME representative will fit him with a mask, educate him on how to use the machine, how to put the mask on, etc. I have placed an order in the chart. Please send the order, talk to patient, send report to referring MD. We will need a FU in sleep clinic for 10 weeks post-PAP set up, please arrange that with me or one of our NPs. Also reinforce the need for compliance with treatment. Thanks,   Star Age, MD, PhD [facility])

## 2022-10-16 NOTE — Telephone Encounter (Signed)
Called to give results AVA  # [id number] with language interpreters [phone] lmvm to call back.

## 2022-10-22 NOTE — Telephone Encounter (Signed)
I called patient again to discuss her sleep study results using [address] [place], [place] [address]. No answer, left a message asking her to call our office back. If patient returns call, please route to POD 4.

## 2022-11-05 ENCOUNTER — Encounter: Payer: Self-pay | Admitting: *Deleted

## 2022-11-05 NOTE — Telephone Encounter (Signed)
I tried to reach the patient again using [employer] ([place] [phone]). The interpreter left a VM for the patient (Ok per [facility]) advising the patient that his SS showed moderate OSA and Dr Frances Furbish recommends patient use an autopap machine for treatment. We asked for a call back (provided number) to discuss the next steps.   I also sent the patient a mychart message and sent patient an unable to contact letter written in spanish.

## 2022-11-24 ENCOUNTER — Telehealth: Payer: Self-pay

## 2022-11-24 NOTE — Telephone Encounter (Signed)
Pt LVM wanting to know results from sleep study - please call pt back at [phone].

## 2022-11-24 NOTE — Telephone Encounter (Signed)
I called pt and LMVM for him to return call.  

## 2022-11-25 NOTE — Telephone Encounter (Signed)
I called pt and was able to give him the results of his sleep study - moderate OSA.  Recommend autopap, I called pt. I reviewed PAP compliance expectations with the pt. Pt is agreeable to starting an auto-PAP. I advised pt that an order will be sent to a DME, [facility], and they will call the pt within about one week after they file with the pt's insurance. They will show the pt how to use the machine, fit for masks, and troubleshoot the auto-PAP if needed. A follow up appt will be made for insurance purposes with [place] 31-89 days post starting  autopap. Pt verbalized understanding to arrive 15 minutes early and bring their auto-PAP.  Pt verbalized understanding of results. Pt had no questions at this time but was encouraged to call back if questions arise. I have sent the order to [facility] and have received confirmation that they have received the order. Pt was made aware to call us when receives machine to make appt.  He verbalized understanding.

## 2022-12-09 ENCOUNTER — Other Ambulatory Visit: Payer: Self-pay | Admitting: Family Medicine

## 2022-12-09 DIAGNOSIS — K219 Gastro-esophageal reflux disease without esophagitis: Secondary | ICD-10-CM

## 2023-02-26 ENCOUNTER — Encounter: Payer: Self-pay | Admitting: Adult Health

## 2023-02-26 ENCOUNTER — Ambulatory Visit: Payer: Commercial Managed Care - PPO | Admitting: Adult Health

## 2023-02-26 VITALS — BP 130/75 | HR 60 | Ht 65.0 in | Wt 189.0 lb

## 2023-02-26 DIAGNOSIS — G4733 Obstructive sleep apnea (adult) (pediatric): Secondary | ICD-10-CM | POA: Diagnosis not present

## 2023-02-26 NOTE — Progress Notes (Signed)
[facility] [address] [place]. [place] [address] [phone]       OFFICE FOLLOW UP NOTE  Mr. Billy Rocha Date of Birth:  [birth year] Medical Record Number:  [id number]    Primary neurologist: Dr. Frances Furbish Reason for visit: Initial CPAP follow-up    SUBJECTIVE:   CHIEF COMPLAINT:  Chief Complaint  Patient presents with   Follow-up    Rm 3, here with interpreter Billy Rocha  Pt is here for initial CPAP. Pt states he is sleeping better with the CPAP, states his nose is very dry. States he is sleeping about 6-7 hours, more so on the weekend.     Follow-up visit:  Prior visit: 06/12/2022 Dr. Frances Furbish  Brief HPI:   Billy Rocha is a 62 y.o. male who was initially evaluated by Dr. Frances Furbish on 05/16/2021 for concerns of underlying sleep apnea and recommended pursuing sleep study but did not complete.  Returned 06/12/2022 for continued sleep-related complaints including snoring, excessive daytime somnolence and witnessed apneas.  ESS 10/24. FSS 30/63.  Sleep study showed moderate to severe sleep apnea with total AHI 27.7/h, REM AHI 26.6/h, supine AHI 62.7/h and O2 nadir of 74% (during supine REM sleep).  AutoPap initiated 12/18/2022.   Interval history:  Compliance report shows satisfactory usage and optimal residual AHI. Reports difficulty tolerating FFM, unable to sleep on side as mask will move and cause leaks, at times can also irritate skin. He also c/o nose feeling dry after short use.  No longer snoring or having witnessed apneas.  Improvement of daytime energy levels.          ROS:   14 system review of systems performed and negative with exception of those listed in HPI  PMH:  Past Medical History:  Diagnosis Date   Arthritis     PSH:  Past Surgical History:  Procedure Laterality Date   CYST EXCISION Left 1993    Social History:  Social History   Socioeconomic History   Marital status: Married    Spouse name: Not on file    Number of children: Not on file   Years of education: Not on file   Highest education level: Not on file  Occupational History   Not on file  Tobacco Use   Smoking status: Never   Smokeless tobacco: Never  Vaping Use   Vaping status: Never Used  Substance and Sexual Activity   Alcohol use: Yes    Alcohol/week: 1.0 standard drink of alcohol    Types: 1 Cans of beer per week    Comment: rare   Drug use: No   Sexual activity: Yes  Other Topics Concern   Not on file  Social History Narrative   Caffiene 1 cup coffee in am,    Working : Production assistant, radio   Married 3 kids.    Grand- daughter 1.    Social Determinants of Health   Financial Resource Strain: Not on file  Food Insecurity: Not on file  Transportation Needs: Not on file  Physical Activity: Not on file  Stress: Not on file  Social Connections: Not on file  Intimate Partner Violence: Not on file    Family History:  Family History  Problem Relation Age of Onset   Cancer Mother    Diabetes Mother    Heart disease Mother    Diabetes Sister    Liver disease Sister    Diabetes Brother    Obesity Brother    Obesity Brother    Obesity Brother  Colon cancer Neg Hx    Colon polyps Neg Hx    Esophageal cancer Neg Hx    Rectal cancer Neg Hx    Stomach cancer Neg Hx     Medications:   Current Outpatient Medications on File Prior to Visit  Medication Sig Dispense Refill   metFORMIN (GLUCOPHAGE-XR) 500 MG 24 hr tablet TAKE 1 TABLET BY MOUTH EVERY NIGHT AT BEDTIME 90 tablet 1   pantoprazole (PROTONIX) 20 MG tablet TAKE 1 TABLET(20 MG) BY MOUTH DAILY 90 tablet 0   tamsulosin (FLOMAX) 0.4 MG CAPS capsule Take 1 capsule (0.4 mg total) by mouth daily. 90 capsule 3   No current facility-administered medications on file prior to visit.    Allergies:  No Known Allergies    OBJECTIVE:  Physical Exam  Vitals:   02/26/23 1241  BP: 130/75  Pulse: 60  Weight: 189 lb (85.7 kg)  Height: 5\' 5"  (1.651 m)   Body mass index  is 31.45 kg/m. No results found.   General: well developed, well nourished, very pleasant middle-age male, seated, in no evident distress Head: head normocephalic and atraumatic.   Neck: supple with no carotid or supraclavicular bruits Cardiovascular: regular rate and rhythm, no murmurs Musculoskeletal: no deformity Skin:  no rash/petichiae Vascular:  Normal pulses all extremities   Neurologic Exam Mental Status: Awake and fully alert. Oriented to place and time. Recent and remote memory intact. Attention span, concentration and fund of knowledge appropriate. Mood and affect appropriate.  Cranial Nerves: Pupils equal, briskly reactive to light. Extraocular movements full without nystagmus. Visual fields full to confrontation. Hearing intact. Facial sensation intact. Face, tongue, palate moves normally and symmetrically.  Motor: Normal bulk and tone. Normal strength in all tested extremity muscles Sensory.: intact to touch , pinprick , position and vibratory sensation.  Coordination: Rapid alternating movements normal in all extremities. Finger-to-nose and heel-to-shin performed accurately bilaterally. Gait and Station: Arises from chair without difficulty. Stance is normal. Gait demonstrates normal stride length and balance without use of AD. Tandem walk and heel toe without difficulty.  Reflexes: 1+ and symmetric. Toes downgoing.         ASSESSMENT/PLAN: Billy Rocha is a 62 y.o. year old male    OSA on CPAP : Compliance report shows satisfactory usage with optimal residual AHI.  Continue current pressure settings. Order placed for change of interface as difficulty tolerating FFM.  Discussed ways to help with nasal dryness.  Discussed continued nightly usage with ensuring greater than 4 hours nightly for optimal benefit and per insurance purposes.  Continue to follow with DME company for any needed supplies or CPAP related concerns     Follow up in 6 months or call  earlier if needed   CC:  PCP: Mliss Sax, MD    I spent 25 minutes of face-to-face and non-face-to-face time with patient assisted by interpreter.  This included previsit chart review, lab review, study review, order entry, electronic health record documentation, patient education regarding diagnosis of sleep apnea with review and discussion of compliance report and answered all other questions to patient's satisfaction   Ihor Austin, [facility]  [facility] [address] [place], [place] [address]  Phone [phone] Fax [phone] Note: This document was prepared with digital dictation and possible smart phrase technology. Any transcriptional errors that result from this process are unintentional.

## 2023-02-26 NOTE — Patient Instructions (Addendum)
You will be called by [facility] to obtain a different mask such as a nasal pillow type mask  If your nose continues to feel dry, you can try to increase the humidity on your machine, try nasal spray or rub Vaseline on the inside of your nose  Continue nightly use of CPAP for good sleep apnea management    Follow up in 6 months or call earlier if needed

## 2023-03-09 ENCOUNTER — Other Ambulatory Visit: Payer: Self-pay | Admitting: Family Medicine

## 2023-03-09 DIAGNOSIS — K219 Gastro-esophageal reflux disease without esophagitis: Secondary | ICD-10-CM

## 2023-03-13 ENCOUNTER — Telehealth: Payer: Self-pay | Admitting: Family Medicine

## 2023-03-13 NOTE — Telephone Encounter (Signed)
error 

## 2023-03-24 ENCOUNTER — Ambulatory Visit: Payer: Commercial Managed Care - PPO | Admitting: Family Medicine

## 2023-03-24 ENCOUNTER — Encounter: Payer: Self-pay | Admitting: Family Medicine

## 2023-03-24 VITALS — BP 118/74 | HR 68 | Temp 97.7°F | Resp 16 | Ht 65.0 in | Wt 196.0 lb

## 2023-03-24 DIAGNOSIS — R7303 Prediabetes: Secondary | ICD-10-CM | POA: Diagnosis not present

## 2023-03-24 DIAGNOSIS — N401 Enlarged prostate with lower urinary tract symptoms: Secondary | ICD-10-CM

## 2023-03-24 DIAGNOSIS — S29019A Strain of muscle and tendon of unspecified wall of thorax, initial encounter: Secondary | ICD-10-CM | POA: Diagnosis not present

## 2023-03-24 DIAGNOSIS — R3911 Hesitancy of micturition: Secondary | ICD-10-CM | POA: Diagnosis not present

## 2023-03-24 MED ORDER — SILODOSIN 8 MG PO CAPS: 8.0000 mg | ORAL_CAPSULE | Freq: Every day | ORAL | 2 refills | Status: DC

## 2023-03-24 MED ORDER — MELOXICAM 7.5 MG PO TABS: 7.5000 mg | ORAL_TABLET | Freq: Every day | ORAL | 0 refills | Status: DC | PRN

## 2023-03-24 MED ORDER — METHOCARBAMOL 500 MG PO TABS: 500.0000 mg | ORAL_TABLET | Freq: Three times a day (TID) | ORAL | 0 refills | Status: DC | PRN

## 2023-03-24 NOTE — Progress Notes (Signed)
Established Patient Office Visit   Subjective:  Patient ID: Billy Rocha, male    DOB: 26-Jul-1960  Age: 62 y.o. MRN: [id number]  Chief Complaint  Patient presents with   Back Pain    Back pain    Back Pain Pertinent negatives include no abdominal pain, tingling or weakness.   Encounter Diagnoses  Name Primary?   Prediabetes Yes   Benign prostatic hyperplasia with urinary hesitancy    Thoracic myofascial strain, initial encounter    For follow-up of above.  Continues Glucophage for prediabetes without issue.  Tamsulosin it seemed to help his urine flow at first but more recently it has not.  He is straining to urinate this morning after he got out of bed.  Force of stream is decreased as well.  There is difficulty initiating a stream.  The drug did help with nocturia.  He experiences nocturia once nightly.  He is married and in a stable relationship and not active sexually outside of that relationship.  Also had issues with mental back pain.  He turned a certain way and there was a sharp pain radiating to the side.  This occurred just part of his back.  Denied any hematuria.   Review of Systems  Constitutional: Negative.   HENT: Negative.    Eyes:  Negative for blurred vision, discharge and redness.  Respiratory: Negative.    Cardiovascular: Negative.   Gastrointestinal:  Negative for abdominal pain.  Genitourinary:  Positive for frequency. Negative for hematuria.  Musculoskeletal:  Positive for back pain. Negative for myalgias.  Skin:  Negative for rash.  Neurological:  Negative for tingling, loss of consciousness and weakness.  Endo/Heme/Allergies:  Negative for polydipsia.     Current Outpatient Medications:    meloxicam (MOBIC) 7.5 MG tablet, Take 1 tablet (7.5 mg total) by mouth daily as needed for pain., Disp: 30 tablet, Rfl: 0   metFORMIN (GLUCOPHAGE-XR) 500 MG 24 hr tablet, TAKE 1 TABLET BY MOUTH EVERY NIGHT AT BEDTIME, Disp: 90 tablet, Rfl: 1    methocarbamol (ROBAXIN) 500 MG tablet, Take 1 tablet (500 mg total) by mouth every 8 (eight) hours as needed for muscle spasms., Disp: 30 tablet, Rfl: 0   pantoprazole (PROTONIX) 20 MG tablet, TAKE 1 TABLET(20 MG) BY MOUTH DAILY, Disp: 90 tablet, Rfl: 0   silodosin (RAPAFLO) 8 MG CAPS capsule, Take 1 capsule (8 mg total) by mouth daily with breakfast., Disp: 30 capsule, Rfl: 2   tamsulosin (FLOMAX) 0.4 MG CAPS capsule, Take 1 capsule (0.4 mg total) by mouth daily., Disp: 90 capsule, Rfl: 3   Objective:     BP 118/74 (BP Location: Left Arm, Patient Position: Sitting, Cuff Size: Normal)   Pulse 68   Temp 97.7 F (36.5 C) (Oral)   Resp 16   Ht 5\' 5"  (1.651 m)   Wt 196 lb (88.9 kg)   SpO2 97%   BMI 32.62 kg/m    Physical Exam Constitutional:      General: He is not in acute distress.    Appearance: Normal appearance. He is not ill-appearing, toxic-appearing or diaphoretic.  HENT:     Head: Normocephalic and atraumatic.     Right Ear: External ear normal.     Left Ear: External ear normal.  Eyes:     General: No scleral icterus.       Right eye: No discharge.        Left eye: No discharge.     Extraocular Movements: Extraocular movements intact.  Conjunctiva/sclera: Conjunctivae normal.  Cardiovascular:     Rate and Rhythm: Normal rate and regular rhythm.  Pulmonary:     Effort: Pulmonary effort is normal. No respiratory distress.     Breath sounds: Normal breath sounds. No wheezing, rhonchi or rales.  Abdominal:     General: Bowel sounds are normal.     Tenderness: There is no right CVA tenderness or left CVA tenderness.  Musculoskeletal:     Cervical back: No rigidity or tenderness.     Thoracic back: No spasms, tenderness or bony tenderness. Normal range of motion.  Skin:    General: Skin is warm and dry.  Neurological:     Mental Status: He is alert and oriented to person, place, and time.  Psychiatric:        Mood and Affect: Mood normal.        Behavior:  Behavior normal.      No results found for any visits on 03/24/23.    The 10-year ASCVD risk score (Arnett DK, et al., 2019) is: 6.7%    Assessment & Plan:   Prediabetes -     Basic metabolic panel -     Hemoglobin A1c  Benign prostatic hyperplasia with urinary hesitancy -     Silodosin; Take 1 capsule (8 mg total) by mouth daily with breakfast.  Dispense: 30 capsule; Refill: 2 -     Urinalysis, Routine w reflex microscopic -     Urine Culture  Thoracic myofascial strain, initial encounter -     Meloxicam; Take 1 tablet (7.5 mg total) by mouth daily as needed for pain.  Dispense: 30 tablet; Refill: 0 -     Methocarbamol; Take 1 tablet (500 mg total) by mouth every 8 (eight) hours as needed for muscle spasms.  Dispense: 30 tablet; Refill: 0    Return in about 3 months (around 06/23/2023), or if symptoms worsen or fail to improve.    Mliss Sax, MD

## 2023-03-25 LAB — BASIC METABOLIC PANEL
BUN: 16 mg/dL (ref 6–23)
CO2: 27 meq/L (ref 19–32)
Calcium: 9.3 mg/dL (ref 8.4–10.5)
Chloride: 102 meq/L (ref 96–112)
Creatinine, Ser: 1.04 mg/dL (ref 0.40–1.50)
GFR: 77.25 mL/min (ref >60.00)
Glucose, Bld: 92 mg/dL (ref 70–99)
Potassium: 4.3 meq/L (ref 3.5–5.1)
Sodium: 137 meq/L (ref 135–145)

## 2023-03-25 LAB — URINALYSIS, ROUTINE W REFLEX MICROSCOPIC
Bilirubin Urine: NEGATIVE
Hgb urine dipstick: NEGATIVE
Ketones, ur: NEGATIVE
Leukocytes,Ua: NEGATIVE
Nitrite: NEGATIVE
RBC / HPF: NONE SEEN (ref 0–?)
Specific Gravity, Urine: <=1.005 — AB (ref 1.000–1.030)
Total Protein, Urine: NEGATIVE
Urine Glucose: NEGATIVE
Urobilinogen, UA: 0.2 (ref 0.0–1.0)
WBC, UA: NONE SEEN (ref 0–?)
pH: 6 (ref 5.0–8.0)

## 2023-03-25 LAB — URINE CULTURE
MICRO NUMBER:: 15446007
Result:: NO GROWTH
SPECIMEN QUALITY:: ADEQUATE

## 2023-03-25 LAB — HEMOGLOBIN A1C: Hgb A1c MFr Bld: 6.4 % (ref 4.6–6.5)

## 2023-04-18 ENCOUNTER — Other Ambulatory Visit: Payer: Self-pay | Admitting: Family Medicine

## 2023-04-18 DIAGNOSIS — S29019A Strain of muscle and tendon of unspecified wall of thorax, initial encounter: Secondary | ICD-10-CM

## 2023-04-28 NOTE — Addendum Note (Signed)
Addended by: Andrez Grime on: 04/28/2023 01:45 PM   Modules accepted: Orders

## 2023-05-17 ENCOUNTER — Other Ambulatory Visit: Payer: Self-pay | Admitting: Family Medicine

## 2023-05-17 DIAGNOSIS — S29019A Strain of muscle and tendon of unspecified wall of thorax, initial encounter: Secondary | ICD-10-CM

## 2023-06-10 ENCOUNTER — Other Ambulatory Visit: Payer: Self-pay | Admitting: Family Medicine

## 2023-06-10 DIAGNOSIS — S29019A Strain of muscle and tendon of unspecified wall of thorax, initial encounter: Secondary | ICD-10-CM

## 2023-06-17 ENCOUNTER — Ambulatory Visit: Payer: Commercial Managed Care - PPO | Admitting: Family Medicine

## 2023-06-18 ENCOUNTER — Ambulatory Visit: Payer: Commercial Managed Care - PPO | Admitting: Family Medicine

## 2023-06-18 ENCOUNTER — Encounter: Payer: Self-pay | Admitting: Family Medicine

## 2023-06-18 VITALS — BP 118/78 | HR 67 | Temp 98.3°F | Ht 65.0 in | Wt 188.2 lb

## 2023-06-18 DIAGNOSIS — Z13220 Encounter for screening for lipoid disorders: Secondary | ICD-10-CM | POA: Diagnosis not present

## 2023-06-18 DIAGNOSIS — R7303 Prediabetes: Secondary | ICD-10-CM | POA: Diagnosis not present

## 2023-06-18 DIAGNOSIS — Z0000 Encounter for general adult medical examination without abnormal findings: Secondary | ICD-10-CM

## 2023-06-18 DIAGNOSIS — N401 Enlarged prostate with lower urinary tract symptoms: Secondary | ICD-10-CM | POA: Diagnosis not present

## 2023-06-18 DIAGNOSIS — R3911 Hesitancy of micturition: Secondary | ICD-10-CM

## 2023-06-18 DIAGNOSIS — Z1211 Encounter for screening for malignant neoplasm of colon: Secondary | ICD-10-CM

## 2023-06-18 DIAGNOSIS — Z114 Encounter for screening for human immunodeficiency virus [HIV]: Secondary | ICD-10-CM

## 2023-06-18 DIAGNOSIS — L84 Corns and callosities: Secondary | ICD-10-CM | POA: Diagnosis not present

## 2023-06-18 LAB — URINALYSIS, ROUTINE W REFLEX MICROSCOPIC
Bilirubin Urine: NEGATIVE
Hgb urine dipstick: NEGATIVE
Ketones, ur: NEGATIVE
Leukocytes,Ua: NEGATIVE
Nitrite: NEGATIVE
RBC / HPF: NONE SEEN (ref 0–?)
Specific Gravity, Urine: 1.02 (ref 1.000–1.030)
Total Protein, Urine: NEGATIVE
Urine Glucose: NEGATIVE
Urobilinogen, UA: 0.2 (ref 0.0–1.0)
pH: 6 (ref 5.0–8.0)

## 2023-06-18 LAB — COMPREHENSIVE METABOLIC PANEL
ALT: 30 U/L (ref 0–53)
AST: 20 U/L (ref 0–37)
Albumin: 4.4 g/dL (ref 3.5–5.2)
Alkaline Phosphatase: 59 U/L (ref 39–117)
BUN: 18 mg/dL (ref 6–23)
CO2: 26 meq/L (ref 19–32)
Calcium: 8.9 mg/dL (ref 8.4–10.5)
Chloride: 106 meq/L (ref 96–112)
Creatinine, Ser: 0.89 mg/dL (ref 0.40–1.50)
GFR: 92.04 mL/min (ref >60.00)
Glucose, Bld: 97 mg/dL (ref 70–99)
Potassium: 4.4 meq/L (ref 3.5–5.1)
Sodium: 138 meq/L (ref 135–145)
Total Bilirubin: 0.5 mg/dL (ref 0.2–1.2)
Total Protein: 6.8 g/dL (ref 6.0–8.3)

## 2023-06-18 LAB — LIPID PANEL
Cholesterol: 187 mg/dL (ref 0–200)
HDL: 56.1 mg/dL (ref >39.00)
LDL Cholesterol: 116 mg/dL — ABNORMAL HIGH (ref 0–99)
NonHDL: 130.75
Total CHOL/HDL Ratio: 3
Triglycerides: 74 mg/dL (ref 0.0–149.0)
VLDL: 14.8 mg/dL (ref 0.0–40.0)

## 2023-06-18 LAB — CBC
HCT: 44.5 % (ref 39.0–52.0)
Hemoglobin: 14.7 g/dL (ref 13.0–17.0)
MCHC: 33 g/dL (ref 30.0–36.0)
MCV: 88.6 fL (ref 78.0–100.0)
Platelets: 183 10*3/uL (ref 150.0–400.0)
RBC: 5.02 Mil/uL (ref 4.22–5.81)
RDW: 14.6 % (ref 11.5–15.5)
WBC: 5.2 10*3/uL (ref 4.0–10.5)

## 2023-06-18 LAB — PSA: PSA: 0.86 ng/mL (ref 0.10–4.00)

## 2023-06-18 LAB — HEMOGLOBIN A1C: Hgb A1c MFr Bld: 6.5 % (ref 4.6–6.5)

## 2023-06-18 MED ORDER — FINASTERIDE 5 MG PO TABS: 5.0000 mg | ORAL_TABLET | Freq: Every day | ORAL | 1 refills | Status: DC

## 2023-06-18 NOTE — Progress Notes (Addendum)
Established Patient Office Visit   Subjective:  Patient ID: Billy Rocha, male    DOB: 24-Jan-1961  Age: 62 y.o. MRN: [id number]  Chief Complaint  Patient presents with   Callouses    Right 4th toe callus that is progressing.   Medical Management of Chronic Issues    3 month follow up. Pt is fasting. Wants colonoscopy.     HPI Encounter Diagnoses  Name Primary?   Healthcare maintenance Yes   Screening for colon cancer    Benign prostatic hyperplasia with urinary hesitancy    Prediabetes    Screening for cholesterol level    Screening for HIV without presence of risk factors    Callus between toes    Here for physical and follow-up of above.  He is fasting.  Exercise can be difficult for him secondary to knee pain.  He is riding a bike.  He does have regular dental care.  He has been using his CPAP machine.  Says that the silodosin unfortunately is working no better than the tamsulosin for his BPH symptoms of decreased force of stream, hesitancy and nocturia.  Continues Glucophage at night for prediabetes   Review of Systems  Constitutional: Negative.   HENT: Negative.    Eyes:  Negative for blurred vision, discharge and redness.  Respiratory: Negative.    Cardiovascular: Negative.   Gastrointestinal:  Negative for abdominal pain.  Genitourinary: Negative.   Musculoskeletal:  Positive for joint pain. Negative for myalgias.  Skin:  Negative for rash.  Neurological:  Negative for tingling, loss of consciousness and weakness.  Endo/Heme/Allergies:  Negative for polydipsia.      10/01/2022   10:48 AM 06/11/2022    8:39 AM 11/06/2021    8:21 AM  Depression screen PHQ 2/9  Decreased Interest 0 0 0  Down, Depressed, Hopeless 0 0 0  PHQ - 2 Score 0 0 0       Current Outpatient Medications:    finasteride (PROSCAR) 5 MG tablet, Take 1 tablet (5 mg total) by mouth daily., Disp: 90 tablet, Rfl: 1   meloxicam (MOBIC) 7.5 MG tablet, TAKE 1 TABLET(7.5 MG) BY MOUTH  DAILY AS NEEDED FOR PAIN, Disp: 30 tablet, Rfl: 0   metFORMIN (GLUCOPHAGE-XR) 500 MG 24 hr tablet, TAKE 1 TABLET BY MOUTH EVERY NIGHT AT BEDTIME, Disp: 90 tablet, Rfl: 1   methocarbamol (ROBAXIN) 500 MG tablet, Take 1 tablet (500 mg total) by mouth every 8 (eight) hours as needed for muscle spasms., Disp: 30 tablet, Rfl: 0   pantoprazole (PROTONIX) 20 MG tablet, TAKE 1 TABLET(20 MG) BY MOUTH DAILY, Disp: 90 tablet, Rfl: 0   silodosin (RAPAFLO) 8 MG CAPS capsule, Take 1 capsule (8 mg total) by mouth daily with breakfast., Disp: 30 capsule, Rfl: 2   Objective:     BP 118/78   Pulse 67   Temp 98.3 F (36.8 C)   Ht 5\' 5"  (1.651 m)   Wt 188 lb 3.2 oz (85.4 kg)   SpO2 97%   BMI 31.32 kg/m  Wt Readings from Last 3 Encounters:  06/18/23 188 lb 3.2 oz (85.4 kg)  03/24/23 196 lb (88.9 kg)  02/26/23 189 lb (85.7 kg)      Physical Exam Constitutional:      General: He is not in acute distress.    Appearance: Normal appearance. He is not ill-appearing, toxic-appearing or diaphoretic.  HENT:     Head: Normocephalic and atraumatic.     Right Ear: Tympanic membrane, ear canal  and external ear normal.     Left Ear: Tympanic membrane, ear canal and external ear normal.     Mouth/Throat:     Mouth: Mucous membranes are moist.     Pharynx: Oropharynx is clear. No oropharyngeal exudate or posterior oropharyngeal erythema.  Eyes:     General: No scleral icterus.       Right eye: No discharge.        Left eye: No discharge.     Extraocular Movements: Extraocular movements intact.     Conjunctiva/sclera: Conjunctivae normal.     Pupils: Pupils are equal, round, and reactive to light.  Cardiovascular:     Rate and Rhythm: Normal rate and regular rhythm.     Pulses:          Dorsalis pedis pulses are 1+ on the right side and 1+ on the left side.       Posterior tibial pulses are 1+ on the right side and 1+ on the left side.  Pulmonary:     Effort: Pulmonary effort is normal. No respiratory  distress.     Breath sounds: Normal breath sounds.  Abdominal:     General: Bowel sounds are normal.     Tenderness: There is no abdominal tenderness. There is no guarding.  Genitourinary:    Prostate: Not enlarged, not tender and no nodules present.     Rectum: Guaiac result negative. No mass, tenderness, anal fissure, external hemorrhoid or internal hemorrhoid. Normal anal tone.  Musculoskeletal:     Cervical back: No rigidity or tenderness.  Skin:    General: Skin is warm and dry.  Neurological:     Mental Status: He is alert and oriented to person, place, and time.  Psychiatric:        Mood and Affect: Mood normal.        Behavior: Behavior normal.    Diabetic Foot Exam - Simple   Simple Foot Form Diabetic Foot exam was performed with the following findings: Yes 06/18/2023  9:01 AM  Visual Inspection See comments: Yes Sensation Testing Intact to touch and monofilament testing bilaterally: Yes Pulse Check Posterior Tibialis and Dorsalis pulse intact bilaterally: Yes Comments Feet are cavus bilaterally.  There are calluses between the fourth and fifth toes bilaterally.       No results found for any visits on 06/18/23.    The 10-year ASCVD risk score (Arnett DK, et al., 2019) is: 7.4%    Assessment & Plan:   Healthcare maintenance -     CBC -     Urinalysis, Routine w reflex microscopic  Screening for colon cancer -     Ambulatory referral to Gastroenterology  Benign prostatic hyperplasia with urinary hesitancy -     PSA -     Finasteride; Take 1 tablet (5 mg total) by mouth daily.  Dispense: 90 tablet; Refill: 1  Prediabetes -     Comprehensive metabolic panel -     Hemoglobin A1c  Screening for cholesterol level -     Comprehensive metabolic panel -     Lipid panel  Screening for HIV without presence of risk factors -     HIV Antibody (routine testing w rflx)  Callus between toes -     Ambulatory referral to Podiatry    Return in about 3 months  (around 09/16/2023).  Patient is ready to try Proscar for his BPH symptoms.  He was given information about Proscar.  Warned that it would take time to work.  Follow-up PSA in 3 months.  Continue Rapaflo.  Information was given on BPH.  Information given on corns and calluses with a podiatry referral.  Referred to GI for colonoscopy.  Information given on health maintenance and disease prevention as well as prediabetes.  May need to increase Glucophage.  Mliss Sax, MD

## 2023-06-19 LAB — HIV ANTIBODY (ROUTINE TESTING W REFLEX): HIV 1&2 Ab, 4th Generation: NONREACTIVE

## 2023-07-14 ENCOUNTER — Other Ambulatory Visit: Payer: Self-pay | Admitting: Family Medicine

## 2023-07-14 DIAGNOSIS — S29019A Strain of muscle and tendon of unspecified wall of thorax, initial encounter: Secondary | ICD-10-CM

## 2023-07-16 ENCOUNTER — Ambulatory Visit: Payer: Commercial Managed Care - PPO | Admitting: Podiatry

## 2023-07-16 ENCOUNTER — Encounter: Payer: Self-pay | Admitting: Podiatry

## 2023-07-16 ENCOUNTER — Ambulatory Visit (INDEPENDENT_AMBULATORY_CARE_PROVIDER_SITE_OTHER): Payer: Commercial Managed Care - PPO

## 2023-07-16 DIAGNOSIS — D2371 Other benign neoplasm of skin of right lower limb, including hip: Secondary | ICD-10-CM

## 2023-07-16 DIAGNOSIS — M898X7 Other specified disorders of bone, ankle and foot: Secondary | ICD-10-CM

## 2023-07-16 NOTE — Progress Notes (Signed)
  Subjective:  Patient ID: Billy Rocha, male    DOB: [birth year],  MRN: [id number] HPI Chief Complaint  Patient presents with   Toe Pain    4th/5th toes right - callused areas rubbing together x 20 years, he used to get a liquid applied to it at the doctors office, wife trims the callus about every 2 weeks, but keeps coming back, keeping it padded   New Patient (Initial Visit)    63 y.o. male presents with the above complaint.   ROS: Denies fever chills nausea vomit muscle aches pains calf pain back pain chest pain shortness of breath.  Past Medical History:  Diagnosis Date   Arthritis    Past Surgical History:  Procedure Laterality Date   CYST EXCISION Left 1993    Current Outpatient Medications:    finasteride  (PROSCAR ) 5 MG tablet, Take 1 tablet (5 mg total) by mouth daily., Disp: 90 tablet, Rfl: 1   meloxicam  (MOBIC ) 7.5 MG tablet, TAKE 1 TABLET(7.5 MG) BY MOUTH DAILY AS NEEDED FOR PAIN, Disp: 30 tablet, Rfl: 0   metFORMIN  (GLUCOPHAGE -XR) 500 MG 24 hr tablet, TAKE 1 TABLET BY MOUTH EVERY NIGHT AT BEDTIME, Disp: 90 tablet, Rfl: 1   methocarbamol  (ROBAXIN ) 500 MG tablet, Take 1 tablet (500 mg total) by mouth every 8 (eight) hours as needed for muscle spasms., Disp: 30 tablet, Rfl: 0   pantoprazole  (PROTONIX ) 20 MG tablet, TAKE 1 TABLET(20 MG) BY MOUTH DAILY, Disp: 90 tablet, Rfl: 0   silodosin  (RAPAFLO ) 8 MG CAPS capsule, Take 1 capsule (8 mg total) by mouth daily with breakfast., Disp: 30 capsule, Rfl: 2  No Known Allergies Review of Systems Objective:  There were no vitals filed for this visit.  General: Well developed, nourished, in no acute distress, alert and oriented x3   Dermatological: Skin is warm, dry and supple bilateral. Nails x 10 are well maintained; remaining integument appears unremarkable at this time. There are no open sores, no preulcerative lesions, no rash or signs of infection present.  Painful benign porokeratotic lesion to the medial aspect of  the distal fifth toe right foot.  No open lesions or wounds.  Vascular: Dorsalis Pedis artery and Posterior Tibial artery pedal pulses are 2/4 bilateral with immedate capillary fill time. Pedal hair growth present. No varicosities and no lower extremity edema present bilateral.   Neruologic: Grossly intact via light touch bilateral. Vibratory intact via tuning fork bilateral. Protective threshold with Semmes Wienstein monofilament intact to all pedal sites bilateral. Patellar and Achilles deep tendon reflexes 2+ bilateral. No Babinski or clonus noted bilateral.   Musculoskeletal: No gross boney pedal deformities bilateral. No pain, crepitus, or limitation noted with foot and ankle range of motion bilateral. Muscular strength 5/5 in all groups tested bilateral.  Adductovarus rotated hammertoe deformity with osteoarthritis.  Gait: Unassisted, Nonantalgic.    Radiographs:  Radiographs taken today forefoot demonstrate some osteoarthritic changes with medial spurring distal phalanx fifth digit right foot.  No acute findings.  Assessment & Plan:   Assessment: Adductovarus rotated hammertoe deformity with osteoarthritic change PIPJ spurs and benign skin lesion.    Plan: Debrided reactive hyperkeratotic lesion discussed options with he and the interpreter and also placed silicone padding.     Billy Rocha, [place]

## 2023-09-02 ENCOUNTER — Other Ambulatory Visit: Payer: Self-pay | Admitting: Family Medicine

## 2023-09-02 ENCOUNTER — Encounter: Payer: Self-pay | Admitting: Family Medicine

## 2023-09-02 DIAGNOSIS — S29019A Strain of muscle and tendon of unspecified wall of thorax, initial encounter: Secondary | ICD-10-CM

## 2023-09-08 NOTE — Progress Notes (Unsigned)
 [facility] [address] [place] [address] [phone]       OFFICE FOLLOW UP NOTE  Mr. Billy Rocha Date of Birth:  05-27-1961 Medical Record Number:  [id number]    Primary neurologist: Dr. Frances Furbish Reason for visit: CPAP follow-up    SUBJECTIVE:   CHIEF COMPLAINT:  No chief complaint on file.  Follow-up visit:  Prior visit: 02/26/2023  Brief HPI:   Billy Rocha is a 63 y.o. male who was initially evaluated by Dr. Frances Furbish on 05/16/2021 for concerns of underlying sleep apnea and recommended pursuing sleep study but did not complete.  Returned 06/12/2022 for continued sleep-related complaints including snoring, excessive daytime somnolence and witnessed apneas.  ESS 10/24. FSS 30/63.  Sleep study showed moderate to severe sleep apnea with total AHI 27.7/h, REM AHI 26.6/h, supine AHI 62.7/h and O2 nadir of 74% (during supine REM sleep).  AutoPap initiated 12/18/2022.  At prior visit, compliance report shows satisfactory usage and optimal residual AHI.  Did report difficulty tolerating FFM and requested change of interface.    Interval history:  Compliance report shows satisfactory usage and optimal residual AHI. Reports difficulty tolerating FFM, unable to sleep on side as mask will move and cause leaks, at times can also irritate skin. He also c/o nose feeling dry after short use.  No longer snoring or having witnessed apneas.  Improvement of daytime energy levels.          ROS:   14 system review of systems performed and negative with exception of those listed in HPI  PMH:  Past Medical History:  Diagnosis Date   Arthritis     PSH:  Past Surgical History:  Procedure Laterality Date   CYST EXCISION Left 1993    Social History:  Social History   Socioeconomic History   Marital status: Married    Spouse name: Not on file   Number of children: Not on file   Years of education: Not on file   Highest education  level: Not on file  Occupational History   Not on file  Tobacco Use   Smoking status: Never   Smokeless tobacco: Never  Vaping Use   Vaping status: Never Used  Substance and Sexual Activity   Alcohol use: Yes    Alcohol/week: 1.0 standard drink of alcohol    Types: 1 Cans of beer per week    Comment: rare   Drug use: No   Sexual activity: Yes  Other Topics Concern   Not on file  Social History Narrative   Caffiene 1 cup coffee in am,    Working : Production assistant, radio   Married 3 kids.    Grand- daughter 1.    Social Drivers of Corporate investment banker Strain: Not on file  Food Insecurity: Not on file  Transportation Needs: Not on file  Physical Activity: Not on file  Stress: Not on file  Social Connections: Not on file  Intimate Partner Violence: Not on file    Family History:  Family History  Problem Relation Age of Onset   Cancer Mother    Diabetes Mother    Heart disease Mother    Diabetes Sister    Liver disease Sister    Diabetes Brother    Obesity Brother    Obesity Brother    Obesity Brother    Colon cancer Neg Hx    Colon polyps Neg Hx    Esophageal cancer Neg Hx    Rectal cancer Neg Hx  Stomach cancer Neg Hx     Medications:   Current Outpatient Medications on File Prior to Visit  Medication Sig Dispense Refill   finasteride (PROSCAR) 5 MG tablet Take 1 tablet (5 mg total) by mouth daily. 90 tablet 1   meloxicam (MOBIC) 7.5 MG tablet TAKE 1 TABLET(7.5 MG) BY MOUTH DAILY AS NEEDED FOR PAIN 30 tablet 0   metFORMIN (GLUCOPHAGE-XR) 500 MG 24 hr tablet TAKE 1 TABLET BY MOUTH EVERY NIGHT AT BEDTIME 90 tablet 1   methocarbamol (ROBAXIN) 500 MG tablet Take 1 tablet (500 mg total) by mouth every 8 (eight) hours as needed for muscle spasms. 30 tablet 0   pantoprazole (PROTONIX) 20 MG tablet TAKE 1 TABLET(20 MG) BY MOUTH DAILY 90 tablet 0   silodosin (RAPAFLO) 8 MG CAPS capsule Take 1 capsule (8 mg total) by mouth daily with breakfast. 30 capsule 2   No current  facility-administered medications on file prior to visit.    Allergies:  No Known Allergies    OBJECTIVE:  Physical Exam  There were no vitals filed for this visit.  There is no height or weight on file to calculate BMI. No results found.   General: well developed, well nourished, very pleasant middle-age male, seated, in no evident distress Head: head normocephalic and atraumatic.   Neck: supple with no carotid or supraclavicular bruits Cardiovascular: regular rate and rhythm, no murmurs Musculoskeletal: no deformity Skin:  no rash/petichiae Vascular:  Normal pulses all extremities   Neurologic Exam Mental Status: Awake and fully alert. Oriented to place and time. Recent and remote memory intact. Attention span, concentration and fund of knowledge appropriate. Mood and affect appropriate.  Cranial Nerves: Pupils equal, briskly reactive to light. Extraocular movements full without nystagmus. Visual fields full to confrontation. Hearing intact. Facial sensation intact. Face, tongue, palate moves normally and symmetrically.  Motor: Normal bulk and tone. Normal strength in all tested extremity muscles Sensory.: intact to touch , pinprick , position and vibratory sensation.  Coordination: Rapid alternating movements normal in all extremities. Finger-to-nose and heel-to-shin performed accurately bilaterally. Gait and Station: Arises from chair without difficulty. Stance is normal. Gait demonstrates normal stride length and balance without use of AD. Tandem walk and heel toe without difficulty.  Reflexes: 1+ and symmetric. Toes downgoing.         ASSESSMENT/PLAN: Billy Rocha is a 63 y.o. year old male    OSA on CPAP : Compliance report shows satisfactory usage with optimal residual AHI.  Continue current pressure settings. Order placed for change of interface as difficulty tolerating FFM.  Discussed ways to help with nasal dryness.  Discussed continued nightly usage  with ensuring greater than 4 hours nightly for optimal benefit and per insurance purposes.  Continue to follow with DME company for any needed supplies or CPAP related concerns     Follow up in 6 months or call earlier if needed   CC:  PCP: Mliss Sax, MD    I spent 25 minutes of face-to-face and non-face-to-face time with patient assisted by interpreter.  This included previsit chart review, lab review, study review, order entry, electronic health record documentation, patient education regarding diagnosis of sleep apnea with review and discussion of compliance report and answered all other questions to patient's satisfaction   Ihor Austin, [facility]  [facility] [address] [place], [place] [address]  Phone [phone] Fax [phone] Note: This document was prepared with digital dictation and possible smart phrase technology. Any transcriptional errors that  result from this process are unintentional.

## 2023-09-09 ENCOUNTER — Ambulatory Visit: Payer: Commercial Managed Care - PPO | Admitting: Adult Health

## 2023-09-09 ENCOUNTER — Encounter: Payer: Self-pay | Admitting: Adult Health

## 2023-09-09 VITALS — BP 127/75 | HR 64 | Ht 65.0 in | Wt 186.0 lb

## 2023-09-09 DIAGNOSIS — R0982 Postnasal drip: Secondary | ICD-10-CM | POA: Diagnosis not present

## 2023-09-09 DIAGNOSIS — G4733 Obstructive sleep apnea (adult) (pediatric): Secondary | ICD-10-CM | POA: Diagnosis not present

## 2023-09-09 NOTE — Patient Instructions (Addendum)
 Your Plan:  Continue nightly use of CPAP for adequate sleep apnea management  Continue to follow with your DME company [employer] for any needed supplies or CPAP related concerns  Would recommend trying to take Zyrtec or some type of allergy medication daily to help with sinus. You can also try nasal sprays such as Flonase which can help with post nasal drip.  If no benefit, would recommend follow up with Dr. Doreene Burke for possible allergy specialist evaluation.      Follow-up in 1 year or call earlier if needed     Thank you for coming to see [place] at [facility]. I hope we have been able to provide you high quality care today.  You may receive a patient satisfaction survey over the next few weeks. We would appreciate your feedback and comments so that we may continue to improve ourselves and the health of our patients.

## 2023-10-14 ENCOUNTER — Other Ambulatory Visit: Payer: Self-pay | Admitting: Family Medicine

## 2023-10-14 DIAGNOSIS — S29019A Strain of muscle and tendon of unspecified wall of thorax, initial encounter: Secondary | ICD-10-CM

## 2023-11-11 ENCOUNTER — Other Ambulatory Visit: Payer: Self-pay

## 2023-11-11 ENCOUNTER — Other Ambulatory Visit: Payer: Self-pay | Admitting: Family Medicine

## 2023-11-11 DIAGNOSIS — S29019A Strain of muscle and tendon of unspecified wall of thorax, initial encounter: Secondary | ICD-10-CM

## 2023-11-16 ENCOUNTER — Ambulatory Visit: Admitting: Family Medicine

## 2023-11-26 ENCOUNTER — Ambulatory Visit: Admitting: Family Medicine

## 2023-11-26 ENCOUNTER — Ambulatory Visit: Payer: Self-pay | Admitting: Family Medicine

## 2023-11-26 ENCOUNTER — Encounter: Payer: Self-pay | Admitting: Family Medicine

## 2023-11-26 VITALS — BP 104/64 | HR 70 | Temp 97.8°F | Ht 65.0 in | Wt 184.6 lb

## 2023-11-26 DIAGNOSIS — N401 Enlarged prostate with lower urinary tract symptoms: Secondary | ICD-10-CM | POA: Diagnosis not present

## 2023-11-26 DIAGNOSIS — R350 Frequency of micturition: Secondary | ICD-10-CM | POA: Diagnosis not present

## 2023-11-26 DIAGNOSIS — Z5181 Encounter for therapeutic drug level monitoring: Secondary | ICD-10-CM

## 2023-11-26 DIAGNOSIS — M25562 Pain in left knee: Secondary | ICD-10-CM

## 2023-11-26 DIAGNOSIS — R7303 Prediabetes: Secondary | ICD-10-CM | POA: Diagnosis not present

## 2023-11-26 DIAGNOSIS — M25561 Pain in right knee: Secondary | ICD-10-CM | POA: Diagnosis not present

## 2023-11-26 DIAGNOSIS — E7800 Pure hypercholesterolemia, unspecified: Secondary | ICD-10-CM | POA: Insufficient documentation

## 2023-11-26 LAB — COMPREHENSIVE METABOLIC PANEL WITH GFR
ALT: 26 U/L (ref 0–53)
AST: 18 U/L (ref 0–37)
Albumin: 4.4 g/dL (ref 3.5–5.2)
Alkaline Phosphatase: 50 U/L (ref 39–117)
BUN: 21 mg/dL (ref 6–23)
CO2: 28 meq/L (ref 19–32)
Calcium: 9 mg/dL (ref 8.4–10.5)
Chloride: 106 meq/L (ref 96–112)
Creatinine, Ser: 0.98 mg/dL (ref 0.40–1.50)
GFR: 82.57 mL/min (ref >60.00)
Glucose, Bld: 144 mg/dL — ABNORMAL HIGH (ref 70–99)
Potassium: 4.2 meq/L (ref 3.5–5.1)
Sodium: 139 meq/L (ref 135–145)
Total Bilirubin: 0.3 mg/dL (ref 0.2–1.2)
Total Protein: 6.7 g/dL (ref 6.0–8.3)

## 2023-11-26 LAB — HEMOGLOBIN A1C: Hgb A1c MFr Bld: 6.5 % (ref 4.6–6.5)

## 2023-11-26 LAB — PSA: PSA: 0.47 ng/mL (ref 0.10–4.00)

## 2023-11-26 LAB — LDL CHOLESTEROL, DIRECT: Direct LDL: 93 mg/dL

## 2023-11-26 NOTE — Progress Notes (Addendum)
 Established Patient Office Visit   Subjective:  Patient ID: Billy Rocha, male    DOB: 1961/01/17  Age: 63 y.o. MRN: [id number]  Chief Complaint  Patient presents with   Leg Pain    Knee pain and edema. Pt state knee pain has resolved but still has leg pain behind his left knee.    Leg Pain  Pertinent negatives include no tingling.   Encounter Diagnoses  Name Primary?   Prediabetes Yes   Benign prostatic hyperplasia with urinary frequency    Pain in both knees, unspecified chronicity    Medication monitoring encounter    Elevated LDL cholesterol level    Knees continue to bother him, the left a little more so than right.  Distant history of "minor injury" to the left knee.  There is pain with standing or after he has been walking for a while.  The knees give way, with the left more affected than the right.  Believes that the finasteride  has been helping his urine flow.  Continues metformin  in at bedtime for prediabetes   Review of Systems  Constitutional: Negative.   HENT: Negative.    Eyes:  Negative for blurred vision, discharge and redness.  Respiratory: Negative.    Cardiovascular: Negative.   Gastrointestinal:  Negative for abdominal pain.  Genitourinary: Negative.   Musculoskeletal:  Positive for joint pain. Negative for myalgias.  Skin:  Negative for rash.  Neurological:  Negative for tingling, loss of consciousness and weakness.  Endo/Heme/Allergies:  Negative for polydipsia.     Current Outpatient Medications:    finasteride  (PROSCAR ) 5 MG tablet, Take 1 tablet (5 mg total) by mouth daily., Disp: 90 tablet, Rfl: 1   meloxicam  (MOBIC ) 7.5 MG tablet, TAKE 1 TABLET(7.5 MG) BY MOUTH DAILY AS NEEDED FOR PAIN, Disp: 30 tablet, Rfl: 0   metFORMIN  (GLUCOPHAGE -XR) 500 MG 24 hr tablet, TAKE 1 TABLET BY MOUTH EVERY NIGHT AT BEDTIME, Disp: 90 tablet, Rfl: 1   Objective:     BP 104/64 (Cuff Size: Normal)   Pulse 70   Temp 97.8 F (36.6 C)   Ht 5\' 5"  (1.651  m)   Wt 184 lb 9.6 oz (83.7 kg)   SpO2 96%   BMI 30.72 kg/m    Physical Exam Constitutional:      General: He is not in acute distress.    Appearance: Normal appearance. He is not ill-appearing, toxic-appearing or diaphoretic.  HENT:     Head: Normocephalic and atraumatic.     Right Ear: External ear normal.     Left Ear: External ear normal.  Eyes:     General: No scleral icterus.       Right eye: No discharge.        Left eye: No discharge.     Extraocular Movements: Extraocular movements intact.     Conjunctiva/sclera: Conjunctivae normal.  Pulmonary:     Effort: Pulmonary effort is normal. No respiratory distress.  Musculoskeletal:     Right knee: Swelling present. No effusion. Normal range of motion. No tenderness.     Instability Tests: Anterior drawer test negative. Posterior drawer test negative.     Left knee: Swelling present. No effusion. Normal range of motion. No tenderness.     Instability Tests: Anterior drawer test negative. Posterior drawer test negative.     Right lower leg: No swelling or tenderness.     Left lower leg: No swelling or tenderness.     Right foot: Normal capillary refill. Normal pulse.  Left foot: Normal capillary refill. Normal pulse.  Skin:    General: Skin is warm and dry.  Neurological:     Mental Status: He is alert and oriented to person, place, and time.  Psychiatric:        Mood and Affect: Mood normal.        Behavior: Behavior normal.      No results found for any visits on 11/26/23.    The 10-year ASCVD risk score (Arnett DK, et al., 2019) is: 6.2%    Assessment & Plan:   Prediabetes -     Comprehensive metabolic panel with GFR -     Hemoglobin A1c  Benign prostatic hyperplasia with urinary frequency -     PSA  Pain in both knees, unspecified chronicity -     Ambulatory referral to Sports Medicine -     DG Knee Complete 4 Views Left; Future -     DG Knee Complete 4 Views Right; Future  Medication  monitoring encounter -     PSA  Elevated LDL cholesterol level -     LDL cholesterol, direct    Return in about 6 months (around 05/28/2024), or if symptoms worsen or fail to improve.    Tonna Frederic, MD

## 2023-12-08 NOTE — Progress Notes (Deleted)
   ICarol Chroman, PhD, LAT, ATC acting as a scribe for Garlan Juniper, MD.  Billy Rocha is a 63 y.o. male who presents to [facility] at [facility] today for bilat knee pain x ***  Knee swelling: Mechanical symptoms: Radiates: Aggravates: Treatments tried:  Pertinent review of systems: ***  Relevant historical information: ***   Exam:  There were no vitals taken for this visit. General: Well Developed, well nourished, and in no acute distress.   MSK: ***    Lab and Radiology Results No results found for this or any previous visit (from the past 72 hours). No results found.     Assessment and Plan: 63 y.o. male with ***   PDMP not reviewed this encounter. No orders of the defined types were placed in this encounter.  No orders of the defined types were placed in this encounter.    Discussed warning signs or symptoms. Please see discharge instructions. Patient expresses understanding.   ***

## 2023-12-09 ENCOUNTER — Ambulatory Visit: Admitting: Family Medicine

## 2023-12-15 NOTE — Progress Notes (Unsigned)
   ICarol Chroman, PhD, LAT, ATC acting as a scribe for Billy Juniper, MD.  Billy Rocha is a 63 y.o. male who presents to [facility] at [facility] today for bilat knee pain x ***  Knee swelling: Mechanical symptoms: Radiates: Aggravates: Treatments tried:  Pertinent review of systems: ***  Relevant historical information: ***   Exam:  There were no vitals taken for this visit. General: Well Developed, well nourished, and in no acute distress.   MSK: ***    Lab and Radiology Results No results found for this or any previous visit (from the past 72 hours). No results found.     Assessment and Plan: 63 y.o. male with ***   PDMP not reviewed this encounter. No orders of the defined types were placed in this encounter.  No orders of the defined types were placed in this encounter.    Discussed warning signs or symptoms. Please see discharge instructions. Patient expresses understanding.   ***

## 2023-12-16 ENCOUNTER — Encounter: Payer: Self-pay | Admitting: Family Medicine

## 2023-12-16 ENCOUNTER — Ambulatory Visit (INDEPENDENT_AMBULATORY_CARE_PROVIDER_SITE_OTHER)

## 2023-12-16 ENCOUNTER — Other Ambulatory Visit: Payer: Self-pay

## 2023-12-16 ENCOUNTER — Ambulatory Visit: Admitting: Family Medicine

## 2023-12-16 VITALS — BP 122/74 | HR 61 | Ht 65.0 in | Wt 185.0 lb

## 2023-12-16 DIAGNOSIS — G8929 Other chronic pain: Secondary | ICD-10-CM

## 2023-12-16 DIAGNOSIS — M25562 Pain in left knee: Secondary | ICD-10-CM

## 2023-12-16 DIAGNOSIS — M25521 Pain in right elbow: Secondary | ICD-10-CM

## 2023-12-16 LAB — SEDIMENTATION RATE: Sed Rate: 8 mm/h (ref 0–20)

## 2023-12-16 NOTE — Patient Instructions (Addendum)
 Thank you for coming in today.   Please use Voltaren gel (Generic Diclofenac Gel) up to 4x daily for pain as needed.  This is available over-the-counter as both the name brand Voltaren gel and the generic diclofenac gel.   Please get an Xray and labs drawn today before you leave   See you back in 1 month

## 2023-12-19 LAB — HLA-B27 ANTIGEN: HLA-B27 Antigen: NEGATIVE

## 2023-12-19 LAB — CYCLIC CITRUL PEPTIDE ANTIBODY, IGG: Cyclic Citrullin Peptide Ab: <16 U

## 2023-12-19 LAB — ANA: Anti Nuclear Antibody (ANA): NEGATIVE

## 2023-12-19 LAB — RHEUMATOID FACTOR: Rheumatoid fact SerPl-aCnc: <10 [IU]/mL (ref <14)

## 2023-12-21 ENCOUNTER — Other Ambulatory Visit: Payer: Self-pay | Admitting: Family Medicine

## 2023-12-21 ENCOUNTER — Ambulatory Visit: Payer: Self-pay | Admitting: Family Medicine

## 2023-12-21 DIAGNOSIS — S29019A Strain of muscle and tendon of unspecified wall of thorax, initial encounter: Secondary | ICD-10-CM

## 2023-12-21 NOTE — Progress Notes (Signed)
 Rheumatology labs were normal.

## 2023-12-21 NOTE — Progress Notes (Signed)
 Left knee x-ray shows mild arthritis.

## 2023-12-21 NOTE — Progress Notes (Signed)
 Right elbow x-ray shows mild arthritis

## 2024-01-19 ENCOUNTER — Other Ambulatory Visit: Payer: Self-pay | Admitting: Family Medicine

## 2024-01-19 DIAGNOSIS — N401 Enlarged prostate with lower urinary tract symptoms: Secondary | ICD-10-CM

## 2024-01-20 ENCOUNTER — Ambulatory Visit: Admitting: Family Medicine

## 2024-01-20 NOTE — Progress Notes (Unsigned)
   LILLETTE Ileana Collet, PhD, LAT, ATC acting as a scribe for Artist Lloyd, MD.  Billy Rocha is a 63 y.o. male who presents to [facility] today for f/u bilat knee, L>R, and R elbow pain. Pt was last seen by Dr. Lloyd on 12/16/23 and rheum labs were obtained, advised to cont meloxicam  and use Voltaren gel.   Today, pt reports no changes in his pain. W/ increased activity his knees feel tired and tight. R elbow pain is now going up into his R shoulder.   Dx testing: 12/16/23 R elbow & L knee XR and labs  Pertinent review of systems: No fevers or chills  Relevant historical information: Hearing loss   Exam:  BP (!) 142/90   Pulse (!) 54   Ht 5' 5 (1.651 m)   Wt 187 lb (84.8 kg)   SpO2 97%   BMI 31.12 kg/m  General: Well Developed, well nourished, and in no acute distress.   MSK: Bilateral knees normal-appearing normal motion. Palpable mobile subcutaneous band of tissue anterior lateral left knee. Knee is not particularly tender to palpation otherwise.  Normal knee motion.  Right shoulder normal-appearing normal motion some pain with abduction.  Intact strength.    Lab and Radiology Results  Diagnostic Limited MSK Ultrasound of: Left knee Left knee area of palpable mobile tissue is relatively benign appearing no visible cystic structure is present.  The meniscus does not appear to be displaced at least on MSK ultrasound examination. Impression: Unclear explanation for mobile tissue on ultrasound.     Assessment and Plan: 63 y.o. male with chronic bilateral knee pain and discomfort and right shoulder pain and a little bit of right elbow pain.  Patient has not improved sufficiently with relative rest and meloxicam .  Will refer to physical therapy.  If not improved consider injection.   PDMP not reviewed this encounter. Orders Placed This Encounter  Procedures   US  LIMITED JOINT SPACE STRUCTURES LOW BILAT(NO LINKED CHARGES)    Reason  for Exam (SYMPTOM  OR DIAGNOSIS REQUIRED):   bilateral knee pain    Preferred imaging location?:   [facility] referral to Physical Therapy    Referral Priority:   Routine    Referral Type:   Physical Medicine    Referral Reason:   Specialty Services Required    Requested Specialty:   Physical Therapy    Number of Visits Requested:   1   No orders of the defined types were placed in this encounter.    Discussed warning signs or symptoms. Please see discharge instructions. Patient expresses understanding.   The above documentation has been reviewed and is accurate and complete Artist Lloyd, M.D.

## 2024-01-21 ENCOUNTER — Other Ambulatory Visit: Payer: Self-pay

## 2024-01-21 ENCOUNTER — Ambulatory Visit: Admitting: Family Medicine

## 2024-01-21 VITALS — BP 142/90 | HR 54 | Ht 65.0 in | Wt 187.0 lb

## 2024-01-21 DIAGNOSIS — M25511 Pain in right shoulder: Secondary | ICD-10-CM | POA: Diagnosis not present

## 2024-01-21 DIAGNOSIS — M25562 Pain in left knee: Secondary | ICD-10-CM

## 2024-01-21 DIAGNOSIS — M25561 Pain in right knee: Secondary | ICD-10-CM | POA: Diagnosis not present

## 2024-01-21 DIAGNOSIS — G8929 Other chronic pain: Secondary | ICD-10-CM

## 2024-01-21 NOTE — Patient Instructions (Addendum)
 Thank you for coming in today.   I've referred you to Physical Therapy.  Let us  know if you don't hear from them in one week.   Check back as needed  Reminder: Dr. Joane will be out of the office starting August 1st, for about 6 weeks

## 2024-05-31 ENCOUNTER — Ambulatory Visit: Admitting: Podiatry

## 2024-05-31 ENCOUNTER — Encounter: Payer: Self-pay | Admitting: Podiatry

## 2024-05-31 DIAGNOSIS — M898X7 Other specified disorders of bone, ankle and foot: Secondary | ICD-10-CM

## 2024-05-31 NOTE — Progress Notes (Signed)
 He presents today with interpreter to discuss surgical options for his 4th and 5th digits of his right foot.  He states that every week his wife has to cut the callus off the inside of the fifth toe right foot as well as the fourth lateral PIPJ.  Objective: Vital signs are stable he is alert and oriented x 3 there is erythema and edema along the medial aspect of the fifth toe lateral aspect of the fourth toe at the level of the PIPJ's.  Radiographs reviewed do demonstrate adductovarus rotated hammertoe deformity fifth with exostosis medial distal phalanx.  Hypertrophic lateral condyle to the head of the fourth PIPJ right foot.  Assessment: Adductovarus rotated hammertoe deformity exostosis 4th and 5th toes.  Plan: Discussed etiology pathology and surgical therapies at this point we consented him for derotational arthroplasty fifth digit right foot medial exostectomy of the fifth and lateral exostectomy on the fourth.  We did discuss pros and cons of the surgery as well as the possible side effects associated with this.  Follow-up with him in the near future for surgical invention.  Process

## 2024-06-13 ENCOUNTER — Other Ambulatory Visit: Payer: Self-pay

## 2024-06-13 ENCOUNTER — Ambulatory Visit: Admitting: Family Medicine

## 2024-06-13 ENCOUNTER — Ambulatory Visit

## 2024-06-13 VITALS — BP 130/76 | HR 62 | Ht 65.0 in | Wt 192.0 lb

## 2024-06-13 DIAGNOSIS — G8929 Other chronic pain: Secondary | ICD-10-CM

## 2024-06-13 DIAGNOSIS — M25561 Pain in right knee: Secondary | ICD-10-CM

## 2024-06-13 DIAGNOSIS — M25562 Pain in left knee: Secondary | ICD-10-CM

## 2024-06-13 NOTE — Progress Notes (Signed)
 LILLETTE Ileana Collet, PhD, LAT, ATC acting as a scribe for Artist Lloyd, MD.  Billy Rocha is a 63 y.o. male who presents to [facility] at [facility] today for exacerbation of his bilat knee pain. Pt was last seen by Dr. Lloyd on 01/21/24 and was referred to PT, but never scheduled any visits.  Today, pt reports he decided to not do any physical therapy, because he tried it before w/o benefit. He notes some pain in the posterior aspect of his R knee. He also reports his knees feel like they are going to give out on him.   Dx testing: 12/16/23 L knee XR and labs   Pertinent review of systems: No fevers or chills  Relevant historical information: Hearing loss   Exam:  BP 130/76   Pulse 62   Ht 5' 5 (1.651 m)   Wt 192 lb (87.1 kg)   SpO2 97%   BMI 31.95 kg/m  General: Well Developed, well nourished, and in no acute distress.   MSK: Knees bilaterally normal appearing normal motion.  Tender palpation posterior knees bilateral knees.  Intact strength.  Some pain with resisted knee flexion    Lab and Radiology Results  Procedure: Real-time Ultrasound Guided Injection of right knee joint superior lateral patella space Device: Philips Affiniti 50G/GE Logiq Images permanently stored and available for review in PACS Verbal informed consent obtained.  Discussed risks and benefits of procedure. Warned about infection, bleeding, hyperglycemia damage to structures among others. Patient expresses understanding and agreement Time-out conducted.   Noted no overlying erythema, induration, or other signs of local infection.   Skin prepped in a sterile fashion.   Local anesthesia: Topical Ethyl chloride.   With sterile technique and under real time ultrasound guidance: 40 mg of Kenalog and 2 mL of Marcaine injected into knee joint. Fluid seen entering the joint capsule.   Completed without difficulty   Pain immediately resolved suggesting accurate placement of the  medication.   Advised to call if fevers/chills, erythema, induration, drainage, or persistent bleeding.   Images permanently stored and available for review in the ultrasound unit.  Impression: Technically successful ultrasound guided injection.   Procedure: Real-time Ultrasound Guided Injection of left knee joint superior lateral patella space Device: Philips Affiniti 50G/GE Logiq Images permanently stored and available for review in PACS Verbal informed consent obtained.  Discussed risks and benefits of procedure. Warned about infection, bleeding, hyperglycemia damage to structures among others. Patient expresses understanding and agreement Time-out conducted.   Noted no overlying erythema, induration, or other signs of local infection.   Skin prepped in a sterile fashion.   Local anesthesia: Topical Ethyl chloride.   With sterile technique and under real time ultrasound guidance: 40 mg of Kenalog and 2 mL of Marcaine injected into knee joint. Fluid seen entering the joint capsule.   Completed without difficulty   Pain immediately resolved suggesting accurate placement of the medication.   Advised to call if fevers/chills, erythema, induration, drainage, or persistent bleeding.   Images permanently stored and available for review in the ultrasound unit.  Impression: Technically successful ultrasound guided injection.   X-ray images right knee obtained today personally and independently interpreted. Mild DJD.  No acute fractures. Await formal radiology review     Assessment and Plan: 63 y.o. male with bilateral knee pain due to some DJD as well as hamstring tendinitis.  Plan for bilateral knee injection and home exercise program.  Consider MRI or formal physical therapy if not  improved in the future.   PDMP not reviewed this encounter. Orders Placed This Encounter  Procedures   US  LIMITED JOINT SPACE STRUCTURES LOW BILAT(NO LINKED CHARGES)    Reason for Exam (SYMPTOM  OR  DIAGNOSIS REQUIRED):   bilateral knee pain    Preferred imaging location?:   [facility] Knee AP/LAT W/[place] Right    Standing Status:   Future    Number of Occurrences:   1    Expiration Date:   07/14/2024    Reason for Exam (SYMPTOM  OR DIAGNOSIS REQUIRED):   bilateral knee pain    Preferred imaging location?:   [facility]   No orders of the defined types were placed in this encounter.    Discussed warning signs or symptoms. Please see discharge instructions. Patient expresses understanding.   The above documentation has been reviewed and is accurate and complete Artist Lloyd, M.D.

## 2024-06-13 NOTE — Patient Instructions (Addendum)
 Thank you for coming in today.   Please get an Xray today before you leave   Please work on the home exercises the athletic trainer went over with you: View at [email or web address] code [id number]  You received an injection today. Seek immediate medical attention if the joint becomes red, extremely painful, or is oozing fluid.  Check back as needed

## 2024-06-14 ENCOUNTER — Telehealth: Payer: Self-pay | Admitting: Podiatry

## 2024-06-14 NOTE — Telephone Encounter (Signed)
 Called and patient is scheduled for surgery on 07/01/2024. Patient not on any GLP1 or blood thinners. Patient pharmacy correct in chart.

## 2024-06-20 ENCOUNTER — Ambulatory Visit: Payer: Self-pay | Admitting: Family Medicine

## 2024-06-20 NOTE — Progress Notes (Signed)
 Right knee x-ray shows mild arthritis.

## 2024-06-22 ENCOUNTER — Telehealth: Payer: Self-pay | Admitting: Podiatry

## 2024-06-22 NOTE — Telephone Encounter (Signed)
 DOS- 07/01/2024  5TH HAMMERTOE REPAIR RT- 28285 4TH+5TH EXOSTECTOMY RT- 71891  [facility] EFFECTIVE DATE- 07/15/2023  DEDUCTIBLE- $1000 REMAINING- $600.52 FAMILY DEDUCTIBLE- $2500 REMAINING- $2100.52 OOP- $3500 REMAINING- $2950.52 FAMILY OOP- $9800 REMAINING- $9250.52 COINSURANCE- 20%  PER U[facility] PORTAL, NO PRIOR AUTHS ARE REQUIRED FOR CPT CODES 71714 AND 28108. DECISION ID# [id number]

## 2024-06-29 ENCOUNTER — Other Ambulatory Visit: Payer: Self-pay | Admitting: Podiatry

## 2024-06-29 MED ORDER — HYDROCODONE-ACETAMINOPHEN 5-325 MG PO TABS: 2.0000 | ORAL_TABLET | Freq: Four times a day (QID) | ORAL | 0 refills | Status: AC | PRN

## 2024-06-29 MED ORDER — ONDANSETRON HCL 4 MG PO TABS: 4.0000 mg | ORAL_TABLET | Freq: Three times a day (TID) | ORAL | 0 refills | Status: AC | PRN

## 2024-06-29 MED ORDER — CEPHALEXIN 500 MG PO CAPS: 500.0000 mg | ORAL_CAPSULE | Freq: Three times a day (TID) | ORAL | 0 refills | Status: DC

## 2024-07-01 DIAGNOSIS — M898X7 Other specified disorders of bone, ankle and foot: Secondary | ICD-10-CM | POA: Diagnosis not present

## 2024-07-06 ENCOUNTER — Ambulatory Visit

## 2024-07-06 ENCOUNTER — Ambulatory Visit (INDEPENDENT_AMBULATORY_CARE_PROVIDER_SITE_OTHER): Admitting: Podiatry

## 2024-07-06 ENCOUNTER — Encounter: Payer: Self-pay | Admitting: Podiatry

## 2024-07-06 VITALS — Ht 65.0 in | Wt 192.0 lb

## 2024-07-06 DIAGNOSIS — D2371 Other benign neoplasm of skin of right lower limb, including hip: Secondary | ICD-10-CM | POA: Diagnosis not present

## 2024-07-13 ENCOUNTER — Ambulatory Visit (INDEPENDENT_AMBULATORY_CARE_PROVIDER_SITE_OTHER): Admitting: Podiatry

## 2024-07-13 DIAGNOSIS — M898X7 Other specified disorders of bone, ankle and foot: Secondary | ICD-10-CM

## 2024-07-13 DIAGNOSIS — D2371 Other benign neoplasm of skin of right lower limb, including hip: Secondary | ICD-10-CM

## 2024-07-13 NOTE — Progress Notes (Signed)
 Patient presents for post-op visit today, POV #2 DOS 07/01/2024 RT 5TH HAMMER TOE REPAIR, RT 4TH AND 5TH EXOSTECTOMY   Doing okay, stable no pain..  Notes: Wife with him today.  Vital Signs: Today's Vitals   07/13/24 0858  PainSc: 2       Radiographs: []  Taken [x]  Not taken  Surgical Site Assessment:  - Dressing:  []  Minimal dry blood, intact []  Reinforced   []  Changed     -Notes: no dressing.  - Incision:  [x]  CDI (clean, dry, intact)  [x]  Mild erythema  []  Drainage noted   -Notes: n/a  - Swelling:  []  None  [x]  Mild  []  Moderate   []  Significant     -Notes: n/a  - Bruising:  [x]  None  []  Present: n/a   - Sutures/Staples:  []  None [x]  Intact  [x]  Removed Today  []  Plan to remove at next visit   -Cast/Splint/Pins: [x]  None []  Intact []  Removed Today []  Plan to remove at next visit []  Replaced  -Signs of infection:  [x]  None  []  Present - Describe:   -DME:    []  None []  AFW [x]  Surgical shoe []  Cast  []  Splint  -Walking status:  [x]  Full WB  []  Partial WB  []  NWB  -Utilizing device:  [x]  None []  Knee Scooter []  Crutches []  Wheelchair    DVT assessment:  [x]  Denies symptoms []  Chest pain/SOB []  Pain in calf/redness/warmth   Redressed DSD and ace wrap. Educated on signs of infection, proper dressing care, pain management, and weight bearing status. Patient will contact provider with any new or worsening symptoms. The provider assessed the patient today and reviewed instructions regarding plan of care.

## 2024-07-13 NOTE — Progress Notes (Signed)
" ° °  Chief Complaint  Patient presents with   Routine Post Op    POV #1 DOS 07/01/2024 RT 5TH HAMMER TOE REPAIR, RT 4TH AND 5TH EXOSTECTOMY     Subjective:  Patient presents today status post hammertoe repair of the 4th and 5th digit of the right foot.  DOS: 07/01/2024.  Performed by Dr. Verta.  Doing well.  No new complaints  Past Medical History:  Diagnosis Date   Arthritis     Past Surgical History:  Procedure Laterality Date   CYST EXCISION Left 1993    Allergies[1]  Objective/Physical Exam Neurovascular status intact.  Incision well coapted with sutures intact. No sign of infectious process noted. No dehiscence. No active bleeding noted.  Moderate edema noted to the toes  Radiographic Exam RT foot 07/06/2024:  Interval resection of the head of the proximal phalanx fifth digit right foot with clean osteotomy.  No complicating features noted  Assessment: 1. s/p hammertoe repair 4th and 5th digit right foot.  Dr. Verta. DOS: 07/01/2024   Plan of Care:  -Patient was evaluated. X-rays reviewed - Dressings changed.  Okay to wash and shower and get the foot wet -Recommend good supportive tennis shoes and sneakers -Return to clinic next scheduled appointment with surgeon, Dr. Verta Thresa EMERSON Janit, DPM [facility]  Dr. Thresa EMERSON Janit, DPM    [address] [place], [place] [address]                Office [phone]  Fax [phone]         [1] No Known Allergies  "

## 2024-07-21 ENCOUNTER — Encounter

## 2024-08-10 ENCOUNTER — Encounter

## 2024-08-11 ENCOUNTER — Encounter

## 2024-08-16 ENCOUNTER — Other Ambulatory Visit: Payer: Self-pay | Admitting: Family Medicine

## 2024-08-16 ENCOUNTER — Ambulatory Visit: Admitting: Podiatry

## 2024-08-16 ENCOUNTER — Encounter: Payer: Self-pay | Admitting: Podiatry

## 2024-08-16 DIAGNOSIS — M898X7 Other specified disorders of bone, ankle and foot: Secondary | ICD-10-CM

## 2024-08-16 DIAGNOSIS — N401 Enlarged prostate with lower urinary tract symptoms: Secondary | ICD-10-CM

## 2024-08-16 DIAGNOSIS — M2041 Other hammer toe(s) (acquired), right foot: Secondary | ICD-10-CM

## 2024-08-17 NOTE — Progress Notes (Signed)
 He presents today with interpreter date of surgery 07/01/2024 hammertoe fifth right with exostectomy's to the 4th and 5th toes.  He states that I feel like the place is coming back in between my toes.  Objective: Vital signs are stable appearing x 3 still considerable swelling to the fifth digit right foot no open lesions or wounds small callus to the lateral aspect of the fourth PIPJ right foot.  No lesions medial fifth.  Assessment: Well-healing surgical toes.  Plan: I debrided the reactive hyperkeratosis between the 4th and 5th toes and expressed to him that he needs to continue to wrap to the fifth digit which she had not been doing to help with the swelling.  Explained to him that once the swelling goes down there will not be as much pressure in the shoes and he should do much better with that.  He understands that and is amenable to it and I will follow-up with him in about a month.

## 2024-09-08 ENCOUNTER — Ambulatory Visit: Payer: Commercial Managed Care - PPO | Admitting: Adult Health

## 2024-09-15 ENCOUNTER — Encounter: Admitting: Podiatry

## 2024-09-26 ENCOUNTER — Encounter: Admitting: Family Medicine
